# Patient Record
Sex: Female | Born: 1965 | Race: White | Hispanic: No | Marital: Married | Smoking: Never smoker
Health system: Southern US, Community
[De-identification: ages and names within clinical notes are randomized; demographics above are authoritative.]

## PROBLEM LIST (undated history)

## (undated) DIAGNOSIS — C50311 Malignant neoplasm of lower-inner quadrant of right female breast: Principal | ICD-10-CM

## (undated) DIAGNOSIS — D219 Benign neoplasm of connective and other soft tissue, unspecified: Secondary | ICD-10-CM

## (undated) DIAGNOSIS — C44712 Basal cell carcinoma of skin of right lower limb, including hip: Secondary | ICD-10-CM

## (undated) DIAGNOSIS — F419 Anxiety disorder, unspecified: Secondary | ICD-10-CM

## (undated) HISTORY — DX: Benign neoplasm of connective and other soft tissue, unspecified: D21.9

## (undated) HISTORY — PX: FINGER FRACTURE SURGERY: SHX638

## (undated) HISTORY — DX: Malignant neoplasm of lower-inner quadrant of right female breast: C50.311

## (undated) HISTORY — PX: WISDOM TOOTH EXTRACTION: SHX21

---

## 1998-09-01 ENCOUNTER — Inpatient Hospital Stay (HOSPITAL_COMMUNITY): Admission: AD | Admit: 1998-09-01 | Discharge: 1998-09-03 | Payer: Self-pay | Admitting: Obstetrics and Gynecology

## 2000-12-30 ENCOUNTER — Other Ambulatory Visit: Admission: RE | Admit: 2000-12-30 | Discharge: 2000-12-30 | Payer: Self-pay | Admitting: Obstetrics and Gynecology

## 2002-01-05 ENCOUNTER — Other Ambulatory Visit: Admission: RE | Admit: 2002-01-05 | Discharge: 2002-01-05 | Payer: Self-pay | Admitting: Obstetrics and Gynecology

## 2003-01-18 ENCOUNTER — Other Ambulatory Visit: Admission: RE | Admit: 2003-01-18 | Discharge: 2003-01-18 | Payer: Self-pay | Admitting: Obstetrics and Gynecology

## 2004-01-20 ENCOUNTER — Other Ambulatory Visit: Admission: RE | Admit: 2004-01-20 | Discharge: 2004-01-20 | Payer: Self-pay | Admitting: Obstetrics and Gynecology

## 2005-01-21 ENCOUNTER — Other Ambulatory Visit: Admission: RE | Admit: 2005-01-21 | Discharge: 2005-01-21 | Payer: Self-pay | Admitting: *Deleted

## 2006-01-21 ENCOUNTER — Other Ambulatory Visit: Admission: RE | Admit: 2006-01-21 | Discharge: 2006-01-21 | Payer: Self-pay | Admitting: Obstetrics & Gynecology

## 2007-01-25 ENCOUNTER — Other Ambulatory Visit: Admission: RE | Admit: 2007-01-25 | Discharge: 2007-01-25 | Payer: Self-pay | Admitting: Obstetrics & Gynecology

## 2008-02-01 ENCOUNTER — Other Ambulatory Visit: Admission: RE | Admit: 2008-02-01 | Discharge: 2008-02-01 | Payer: Self-pay | Admitting: Obstetrics & Gynecology

## 2013-02-16 ENCOUNTER — Encounter: Payer: Self-pay | Admitting: Obstetrics & Gynecology

## 2013-05-24 DIAGNOSIS — C44712 Basal cell carcinoma of skin of right lower limb, including hip: Secondary | ICD-10-CM

## 2013-05-24 HISTORY — DX: Basal cell carcinoma of skin of right lower limb, including hip: C44.712

## 2013-05-24 HISTORY — PX: SKIN BIOPSY: SHX1

## 2013-07-08 ENCOUNTER — Other Ambulatory Visit: Payer: Self-pay | Admitting: Obstetrics & Gynecology

## 2013-07-09 NOTE — Telephone Encounter (Signed)
Last refilled: AEX 06/05/12 #4 packs with 3 refills   Current AEX: 08/31/13 with Dr.Miller  Patient takes [facility] continuous   [place] #4 packs with no refills sent to pharmacy to last patient until AEX  Routed to provider, encounter closed.

## 2013-08-29 ENCOUNTER — Ambulatory Visit: Payer: Self-pay | Admitting: Obstetrics & Gynecology

## 2013-08-30 ENCOUNTER — Encounter: Payer: Self-pay | Admitting: Obstetrics & Gynecology

## 2013-08-31 ENCOUNTER — Ambulatory Visit (INDEPENDENT_AMBULATORY_CARE_PROVIDER_SITE_OTHER): Payer: BC Managed Care – PPO | Admitting: Obstetrics & Gynecology

## 2013-08-31 ENCOUNTER — Ambulatory Visit: Payer: Self-pay | Admitting: Obstetrics & Gynecology

## 2013-08-31 ENCOUNTER — Encounter: Payer: Self-pay | Admitting: Obstetrics & Gynecology

## 2013-08-31 VITALS — BP 100/58 | HR 60 | Resp 16 | Ht 64.75 in | Wt 125.8 lb

## 2013-08-31 DIAGNOSIS — Z0000 Encounter for general adult medical examination without abnormal findings: Secondary | ICD-10-CM

## 2013-08-31 DIAGNOSIS — Z124 Encounter for screening for malignant neoplasm of cervix: Secondary | ICD-10-CM

## 2013-08-31 DIAGNOSIS — Z01419 Encounter for gynecological examination (general) (routine) without abnormal findings: Secondary | ICD-10-CM

## 2013-08-31 LAB — POCT URINALYSIS DIPSTICK
Bilirubin, UA: NEGATIVE
GLUCOSE UA: NEGATIVE
Ketones, UA: NEGATIVE
LEUKOCYTES UA: NEGATIVE
NITRITE UA: NEGATIVE
PROTEIN UA: NEGATIVE
UROBILINOGEN UA: NEGATIVE
pH, UA: 5

## 2013-08-31 LAB — HEMOGLOBIN, FINGERSTICK: HEMOGLOBIN, FINGERSTICK: 11.8 g/dL — AB (ref 12.0–16.0)

## 2013-08-31 MED ORDER — LEVONORGESTREL-ETHINYL ESTRAD 0.1-20 MG-MCG PO TABS: 1.0000 | ORAL_TABLET | Freq: Every day | ORAL | Status: DC

## 2013-08-31 NOTE — Progress Notes (Signed)
48 y.o. G1P1 MarriedCaucasianF here for annual exam.  Daughter turns 8 today and getting driver's permit.  Cycles still regular.  Lasting 2-3 days.  Does take pills continuously.    Seen yearly at [facility].  Labs done there.  Everything is good per patient.    Patient's last menstrual period was 08/07/2013.          Sexually active: yes  The current method of family planning is OCP (estrogen/progesterone).    Exercising: yes  weights, cardio, pilates Smoker:  no  Health Maintenance: Pap:  06/03/11 WNL/negative HR HPV History of abnormal Pap:  no MMG:  01/19/13 3D-normal Colonoscopy:  none BMD:   none TDaP:  9/09 Screening Labs: [facility], Hb today: 11.8, Urine today: RBC-trace   reports that she has never smoked. She has never used smokeless tobacco. She reports that she drinks alcohol. She reports that she does not use illicit drugs.  Past Medical History  Diagnosis Date  . Fibroids     Past Surgical History  Procedure Laterality Date  . Finger fracture surgery      left hand   . Skin biopsy  1/15    basal cell    Current Outpatient Prescriptions  Medication Sig Dispense Refill  . CRYSELLE-28 0.3-30 MG-MCG tablet TAKE 1 TABLET DAILY. NO    PLACEBOS, TAKE             CONTINUOUSLY.  4 Package  0  . loratadine (CLARITIN) 10 MG tablet Take 10 mg by mouth daily as needed for allergies.      . Multiple Vitamins-Minerals (MULTIVITAMIN PO) Take by mouth.       No current facility-administered medications for this visit.    Family History  Problem Relation Age of Onset  . Diabetes Paternal Grandfather   . Colon cancer Maternal Grandfather   . Breast cancer Other     maternal great grandmother  . Heart attack Paternal Grandfather   . Renal Disease Maternal Grandfather     ROS:  Pertinent items are noted in HPI.  Otherwise, a comprehensive ROS was negative.  Exam:   BP 100/58  Pulse 60  Resp 16  Ht 5' 4.75" (1.645 m)  Wt 125 lb 12.8 oz (57.063 kg)  BMI 21.09 kg/m2  LMP  08/07/2013  Weight change: +1#   Height: 5' 4.75" (164.5 cm)  Ht Readings from Last 3 Encounters:  08/31/13 5' 4.75" (1.645 m)    General appearance: alert, cooperative and appears stated age Head: Normocephalic, without obvious abnormality, atraumatic Neck: no adenopathy, supple, symmetrical, trachea midline and thyroid normal to inspection and palpation Lungs: clear to auscultation bilaterally Breasts: normal appearance, no masses or tenderness Heart: regular rate and rhythm Abdomen: soft, non-tender; bowel sounds normal; no masses,  no organomegaly Extremities: extremities normal, atraumatic, no cyanosis or edema Skin: Skin color, texture, turgor normal. No rashes or lesions Lymph nodes: Cervical, supraclavicular, and axillary nodes normal. No abnormal inguinal nodes palpated Neurologic: Grossly normal   Pelvic: External genitalia:  no lesions              Urethra:  normal appearing urethra with no masses, tenderness or lesions              Bartholins and Skenes: normal                 Vagina: normal appearing vagina with normal color and discharge, no lesions              Cervix: no  lesions              Pap taken: yes Bimanual Exam:  Uterus: Enlarged and globular, 8 weeks, mobile.  No change in size.              Adnexa: normal adnexa and no mass, fullness, tenderness               Rectovaginal: Confirms               Anus:  normal sphincter tone, no lesions  A:  Well Woman with normal exam Fibroid uterus On OCPs for [facility] Grandfather with colon cancer in his 50 Grade 4 breast density  P:   Mammogram yearly.  D/W pt 3D MMG pap smear only obtained Labs with [facility].  Pt will send me a copy. Colonoscopy at age 35. Change OCPs to [facility].  Rx for 90 day supply/4RF. return annually or prn  An After Visit Summary was printed and given to the patient.

## 2013-08-31 NOTE — Patient Instructions (Signed)

## 2013-09-03 LAB — IPS PAP SMEAR ONLY

## 2014-03-25 ENCOUNTER — Encounter: Payer: Self-pay | Admitting: Obstetrics & Gynecology

## 2014-09-06 ENCOUNTER — Encounter: Payer: Self-pay | Admitting: Obstetrics & Gynecology

## 2014-09-06 ENCOUNTER — Ambulatory Visit (INDEPENDENT_AMBULATORY_CARE_PROVIDER_SITE_OTHER): Payer: BLUE CROSS/BLUE SHIELD | Admitting: Obstetrics & Gynecology

## 2014-09-06 VITALS — BP 96/60 | HR 80 | Resp 16 | Ht 65.0 in | Wt 127.0 lb

## 2014-09-06 DIAGNOSIS — Z01419 Encounter for gynecological examination (general) (routine) without abnormal findings: Secondary | ICD-10-CM

## 2014-09-06 MED ORDER — LEVONORGESTREL-ETHINYL ESTRAD 0.1-20 MG-MCG PO TABS: 1.0000 | ORAL_TABLET | Freq: Every day | ORAL | Status: DC

## 2014-09-06 NOTE — Progress Notes (Signed)
49 y.o. G1P1 MarriedCaucasianF here for annual exam.  Doing well.  Daughter has started driving and is a Administrator, arts.  Cycles are still regular.  Flow is getting lighter and now two to three days.    Does yearly biometrics testing for health insurance.  All lab testing done then was normal.    Patient's last menstrual period was 08/28/2014.          Sexually active: Yes.    The current method of family planning is OCP (estrogen/progesterone).    Exercising: Yes.    Weights, cardio, pilates and yoga Smoker:  no  Health Maintenance: Pap:  08/31/13 Neg History of abnormal Pap:  no MMG: 01/21/14 BIRADS1:Neg Self Breast Exam: yes, monthly Colonoscopy: None BMD:   None TDaP: 2009  Screening Labs: PCP, Hb today: PCP, Urine today: PCP   reports that she has never smoked. She has never used smokeless tobacco. She reports that she drinks alcohol. She reports that she does not use illicit drugs.  Past Medical History  Diagnosis Date  . Fibroids     Past Surgical History  Procedure Laterality Date  . Finger fracture surgery      left hand   . Skin biopsy  1/15    basal cell, right thigh    Current Outpatient Prescriptions  Medication Sig Dispense Refill  . levonorgestrel-ethinyl estradiol (AVIANE,ALESSE,LESSINA) 0.1-20 MG-MCG tablet Take 1 tablet by mouth daily. 3 Package 4  . loratadine (CLARITIN) 10 MG tablet Take 10 mg by mouth daily as needed for allergies.    . Multiple Vitamins-Minerals (MULTIVITAMIN PO) Take by mouth.    . terbinafine (LAMISIL) 250 MG tablet Take 250 mg by mouth daily.  1   No current facility-administered medications for this visit.    Family History  Problem Relation Age of Onset  . Diabetes Paternal Grandfather   . Colon cancer Maternal Grandfather 91    lived to 72  . Breast cancer Other     maternal great grandmother  . Heart attack Paternal Grandfather   . Renal Disease Maternal Grandfather     ROS:  Pertinent items are noted in HPI.  Otherwise, a  comprehensive ROS was negative.  Exam:   BP 96/60 mmHg  Pulse 80  Resp 16  Ht 5\' 5"  (1.651 m)  Wt 127 lb (57.607 kg)  BMI 21.13 kg/m2  LMP 08/28/2014 Height: 5\' 5"  (165.1 cm)  Ht Readings from Last 3 Encounters:  09/06/14 5\' 5"  (1.651 m)  08/31/13 5' 4.75" (1.645 m)    General appearance: alert, cooperative and appears stated age Head: Normocephalic, without obvious abnormality, atraumatic Neck: no adenopathy, supple, symmetrical, trachea midline and thyroid normal to inspection and palpation Lungs: clear to auscultation bilaterally Breasts: normal appearance, no masses or tenderness Heart: regular rate and rhythm Abdomen: soft, non-tender; bowel sounds normal; no masses,  no organomegaly Extremities: extremities normal, atraumatic, no cyanosis or edema Skin: Skin color, texture, turgor normal. No rashes or lesions Lymph nodes: Cervical, supraclavicular, and axillary nodes normal. No abnormal inguinal nodes palpated Neurologic: Grossly normal   Pelvic: External genitalia:  no lesions              Urethra:  normal appearing urethra with no masses, tenderness or lesions              Bartholins and Skenes: normal                 Vagina: normal appearing vagina with normal color and discharge, no lesions  Cervix: no lesions              Pap taken: No. Bimanual Exam:  Uterus:  normal size, contour, position, consistency, mobility, non-tender              Adnexa: normal adnexa and no mass, fullness, tenderness               Rectovaginal: Confirms               Anus:  normal sphincter tone, no lesions  Chaperone was present for exam.  A:  Well Woman with normal exam Fibroid uterus On OCPs for [facility] Grandfather with colon cancer Grade 4 breast density  P: Mammogram yearly. D/W pt 3D MMG pap smear only obtained 2015.  No Pap. Colonoscopy at age 2. On A[facility]se. Rx for 90 day supply/4RF. return annually or prn

## 2015-01-23 HISTORY — PX: BREAST SURGERY: SHX581

## 2015-02-07 ENCOUNTER — Encounter: Payer: Self-pay | Admitting: *Deleted

## 2015-02-07 ENCOUNTER — Telehealth: Payer: Self-pay | Admitting: *Deleted

## 2015-02-07 ENCOUNTER — Other Ambulatory Visit: Payer: Self-pay | Admitting: Radiology

## 2015-02-07 DIAGNOSIS — C50911 Malignant neoplasm of unspecified site of right female breast: Secondary | ICD-10-CM

## 2015-02-07 DIAGNOSIS — C50311 Malignant neoplasm of lower-inner quadrant of right female breast: Secondary | ICD-10-CM

## 2015-02-07 HISTORY — DX: Malignant neoplasm of lower-inner quadrant of right female breast: C50.311

## 2015-02-07 NOTE — Telephone Encounter (Signed)
Confirmed [facility] for 02/12/15 at 0830 .  Instructions and contact information given.

## 2015-02-10 ENCOUNTER — Ambulatory Visit
Admission: RE | Admit: 2015-02-10 | Discharge: 2015-02-10 | Disposition: A | Discharge status: Home / Self Care | Payer: BLUE CROSS/BLUE SHIELD | Source: Ambulatory Visit | Attending: Radiology | Admitting: Radiology

## 2015-02-10 DIAGNOSIS — C50911 Malignant neoplasm of unspecified site of right female breast: Secondary | ICD-10-CM

## 2015-02-10 MED ORDER — GADOBENATE DIMEGLUMINE 529 MG/ML IV SOLN
10.0000 mL | Freq: Once | INTRAVENOUS | Status: AC | PRN
  Administered 2015-02-10: 10 mL via INTRAVENOUS

## 2015-02-11 NOTE — Progress Notes (Signed)
[facility]  [phone] Fax:([phone]) [place] Note   Patient Care Team: Leighton Ruff, MD as PCP - General (Family Medicine) Alphonsa Overall, MD as Consulting Physician (General Surgery) Truitt Merle, MD as Consulting Physician (Hematology) Thea Silversmith, MD as Consulting Physician (Radiation Oncology) Mauro Kaufmann, RN as Registered Nurse Rockwell Germany, RN as Registered Nurse 02/12/2015  CHIEF COMPLAINTS/PURPOSE OF CONSULTATION: Newly diagnosed right breast DCIS   Oncology History   Breast cancer of lower-inner quadrant of right female breast   Staging form: Breast, AJCC 7th Edition     Clinical stage from 02/12/2015: Stage 0 (Tis (DCIS), N0, M0) - Unsigned       Staging comments: Staged at breast conference on 9.21.16        Breast cancer of lower-inner quadrant of right female breast   01/28/2015 Mammogram Screening mammogram showed new regional heterogeneous calcifications in the right breast central to the nipple midline depth.    02/06/2015 Initial Biopsy Right breast core needle biopsy showed DCIS with calcifications and necrosis.   02/07/2015 Initial Diagnosis Breast cancer of lower-inner quadrant of right female breast   02/10/2015 Imaging Breast MRI showed non-mass enhancement involves majority of the lower, outer quadrant of the right breast spanning at least 3.4 x 5.0 x 5.2 cm. No other lesion or adenopathy.    HISTORY OF PRESENTING ILLNESS:  Carolyn Berg 49 y.o. female is here because of newly diagnosed right breast cancer. She presents to our multidisciplinary breast clinic to discuss further management. She is accompanied by her husband.  This was discovered by screening mammogram. She is very compliant with year screening mammogram. Her screening mammogram on 01/28/2015 showed a new regional heterogeneous calcification in the lower-outer quadrant of right breast. She underwent core needle biopsy on 02/07/2015, which showed  DCIS.  She feels well overall, denies any symptoms, no pain, dyspnea, weight loss or night sweats. She is a housewife, very physically active, no significant past medical history, no family history of breast cancer. She is premenopausal, takes birth control pill.  MEDICAL HISTORY:  Past Medical History  Diagnosis Date  . Fibroids   . 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 Breast cancer     SURGICAL HISTORY: Past Surgical History  Procedure Laterality Date  . Finger fracture surgery      left hand   . Skin biopsy  1/15    basal cell, right thigh   GYN HISTORY  Menarchal:13 LMP: 02/12/2015 Contraceptive: 20+ years  HRT: N/A  G1P1: daughter is 5, no breast feeding, no plan for more children    SOCIAL HISTORY: Social History   Social History  . Marital Status: Married    Spouse Name: N/A  . Number of Children: 1 daughter 52 yo   . Years of Education: N/A   Occupational History  . Not on file.   Social History Main Topics  . Smoking status: Never Smoker   . Smokeless tobacco: Never Used  . Alcohol Use: Yes     Comment: occ glass of wine  . Drug Use: No  . Sexual Activity:    Partners: Male    Birth Control/ Protection: Pill   Other Topics Concern  . Not on file   Social History Narrative    FAMILY HISTORY: Family History  Problem Relation Age of Onset  . Diabetes Paternal Grandfather   . Heart attack Paternal Grandfather   . Colon cancer Maternal Grandfather  49    lived to 53  . Renal Disease Maternal Grandfather   . Breast cancer Other     maternal great grandmother    ALLERGIES:  has No Known Allergies.  MEDICATIONS:  Current Outpatient Prescriptions  Medication Sig Dispense Refill  . levonorgestrel-ethinyl estradiol (AVIANE,ALESSE,LESSINA) 0.1-20 MG-MCG tablet Take 1 tablet by mouth daily. 3 Package 4   No current facility-administered medications for this visit.    REVIEW OF SYSTEMS:   Constitutional:  Denies fevers, chills or abnormal night sweats Eyes: Denies blurriness of vision, double vision or watery eyes Ears, nose, mouth, throat, and face: Denies mucositis or sore throat Respiratory: Denies cough, dyspnea or wheezes Cardiovascular: Denies palpitation, chest discomfort or lower extremity swelling Gastrointestinal:  Denies nausea, heartburn or change in bowel habits Skin: Denies abnormal skin rashes Lymphatics: Denies new lymphadenopathy or easy bruising Neurological:Denies numbness, tingling or new weaknesses Behavioral/Psych: Mood is stable, no new changes  All other systems were reviewed with the patient and are negative.  PHYSICAL EXAMINATION: ECOG PERFORMANCE STATUS: 0 - Asymptomatic  Filed Vitals:   02/12/15 0858  BP: 127/73  Pulse: 96  Temp: 98.2 F (36.8 C)  Resp: 20   Filed Weights   02/12/15 0858  Weight: 122 lb 14.4 oz (55.747 kg)    GENERAL:alert, no distress and comfortable SKIN: skin color, texture, turgor are normal, no rashes or significant lesions EYES: normal, conjunctiva are pink and non-injected, sclera clear OROPHARYNX:no exudate, no erythema and lips, buccal mucosa, and tongue normal  NECK: supple, thyroid normal size, non-tender, without nodularity LYMPH:  no palpable lymphadenopathy in the cervical, axillary or inguinal LUNGS: clear to auscultation and percussion with normal breathing effort HEART: regular rate & rhythm and no murmurs and no lower extremity edema ABDOMEN:abdomen soft, non-tender and normal bowel sounds Musculoskeletal:no cyanosis of digits and no clubbing  PSYCH: alert & oriented x 3 with fluent speech NEURO: no focal motor/sensory deficits Breasts: Breast inspection showed them to be symmetrical with no nipple discharge. (+) Mild Skin ecchymosis in the lower outer quadrant of right breast biopsy site, Palpation of both breasts and axilla revealed no obvious mass that I could appreciate.   LABORATORY DATA:  I have reviewed  the data as listed Lab Results  Component Value Date   WBC 4.1 02/12/2015   HGB 12.6 02/12/2015   HCT 37.9 02/12/2015   MCV 92.7 02/12/2015   PLT 347 02/12/2015    Recent Labs  02/12/15 0848  NA 141  K 4.2  CO2 25  GLUCOSE 95  BUN 10.9  CREATININE 0.9  CALCIUM 9.1  PROT 7.1  ALBUMIN 3.8  AST 16  ALT 11  ALKPHOS 50  BILITOT 0.56    PATHOLOGY REPORT  Diagnosis 02/06/2015 Breast, right, needle core biopsy -Ductal carcinoma in situ with calcifications and necrosis.  ER and PR pending    RADIOGRAPHIC STUDIES: I have personally reviewed the radiological images as listed and agreed with the findings in the report.  Mr Breast Bilateral W Wo Contrast 02/10/2015    FINDINGS: Breast composition: d. Extreme fibroglandular tissue.  Background parenchymal enhancement: Marked  Right breast: There is asymmetric non mass enhancement involving the majority of the lower, outer quadrant of the right breast when compared to the left. This non mass enhancement is difficult to precisely measure given the marked background enhancement. However, the non mass enhancement appears to span at least 3.4 (AP) x 5.0 (TR) x 5.2 (CC) cm. Biopsy marker artifact corresponding to the known site  of DCIS is seen within the inferolateral aspect of this enhancement in the lower, outer right breast, series 5, image 115.  Left breast: No dominant mass or suspicious enhancement. There is marked background parenchymal enhancement.  Lymph nodes: No abnormal appearing lymph nodes.  Ancillary findings:  None.   IMPRESSION: Asymmetric, non mass enhancement involves the majority of the lower, outer quadrant of the right breast spanning at least 3.4 x 5.0 x 5.2 cm. This non mass enhancement appears to correspond to the extent of calcifications seen mammographically. If breast conservation is being considered, additional stereotactic biopsy of the right breast may be warranted for further evaluation of extent of disease.   RECOMMENDATION: Treatment planning.  BI-RADS CATEGORY  6: Known biopsy-proven malignancy.   Electronically Signed   By: Pamelia Hoit M.D.   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  1. right breast DCIS, ER/PR pending -I reviewed her imaging findings including breast MRI, and a biopsy results in details with the patient and her husband. -Due to the large size of the non-mass enhancement in the right breast, Dr. Lucia Gaskins recommends mastectomy, if she still wants lumpectomy, she would need additional biopsy of the non-mass enhancement area. -The patient had early stage disease. She is considered cured of disease by complete surgical resection. We discussed the increased risk of second breast cancer, including invasive breast cancer. -Any form of adjuvant treatment is for prevention of disease recurrence.  -If she has mastectomy, she would not need adjuvant breast radiation -If her tumor is ER or PR positive, I recommend chemoprevention with tamoxifen. The potential side effects of tamoxifen were reviewed with patient, and I'll see her back after her surgery. -I'll call her with ER/PR results when this is available. -She is very interested in genetic testing. She will call her mother to see if she has family history of breast cancer which she is not aware. She will call us back.  Plan: I'll see her 2-3 weeks after her breast surgery, to review her final surgical path and finalize her antiestrogen therapy.  All questions were answered. The patient knows to call the clinic with any problems, questions or concerns. I spent 40 minutes counseling the patient face to face. The total time spent in the appointment was 50 minutes and more than 50% was on counseling.       Truitt Merle, MD 02/12/2015 11:04 AM

## 2015-02-12 ENCOUNTER — Encounter: Payer: Self-pay | Admitting: *Deleted

## 2015-02-12 ENCOUNTER — Ambulatory Visit (HOSPITAL_BASED_OUTPATIENT_CLINIC_OR_DEPARTMENT_OTHER): Payer: BLUE CROSS/BLUE SHIELD | Admitting: Hematology

## 2015-02-12 ENCOUNTER — Encounter: Payer: Self-pay | Admitting: Skilled Nursing Facility1

## 2015-02-12 ENCOUNTER — Encounter: Payer: Self-pay | Admitting: Genetic Counselor

## 2015-02-12 ENCOUNTER — Encounter: Payer: Self-pay | Admitting: Hematology

## 2015-02-12 ENCOUNTER — Telehealth: Payer: Self-pay | Admitting: Obstetrics & Gynecology

## 2015-02-12 ENCOUNTER — Encounter: Payer: Self-pay | Admitting: Nurse Practitioner

## 2015-02-12 ENCOUNTER — Encounter: Payer: Self-pay | Admitting: Physical Therapy

## 2015-02-12 ENCOUNTER — Ambulatory Visit: Payer: BLUE CROSS/BLUE SHIELD | Attending: Surgery | Admitting: Physical Therapy

## 2015-02-12 ENCOUNTER — Other Ambulatory Visit (HOSPITAL_BASED_OUTPATIENT_CLINIC_OR_DEPARTMENT_OTHER): Payer: BLUE CROSS/BLUE SHIELD

## 2015-02-12 ENCOUNTER — Ambulatory Visit
Admission: RE | Admit: 2015-02-12 | Discharge: 2015-02-12 | Disposition: A | Discharge status: Home / Self Care | Payer: BLUE CROSS/BLUE SHIELD | Source: Ambulatory Visit | Attending: Radiation Oncology | Admitting: Radiation Oncology

## 2015-02-12 ENCOUNTER — Ambulatory Visit (HOSPITAL_BASED_OUTPATIENT_CLINIC_OR_DEPARTMENT_OTHER): Payer: BLUE CROSS/BLUE SHIELD | Admitting: Genetic Counselor

## 2015-02-12 VITALS — BP 127/73 | HR 96 | Temp 98.2°F | Resp 20 | Ht 64.5 in | Wt 122.9 lb

## 2015-02-12 DIAGNOSIS — C50311 Malignant neoplasm of lower-inner quadrant of right female breast: Secondary | ICD-10-CM

## 2015-02-12 DIAGNOSIS — C50911 Malignant neoplasm of unspecified site of right female breast: Secondary | ICD-10-CM | POA: Diagnosis not present

## 2015-02-12 DIAGNOSIS — Z803 Family history of malignant neoplasm of breast: Secondary | ICD-10-CM

## 2015-02-12 DIAGNOSIS — Z800 Family history of malignant neoplasm of digestive organs: Secondary | ICD-10-CM

## 2015-02-12 LAB — COMPREHENSIVE METABOLIC PANEL (CC13)
ALBUMIN: 3.8 g/dL (ref 3.5–5.0)
ALK PHOS: 50 U/L (ref 40–150)
ALT: 11 U/L (ref 0–55)
AST: 16 U/L (ref 5–34)
Anion Gap: 9 mEq/L (ref 3–11)
BUN: 10.9 mg/dL (ref 7.0–26.0)
CO2: 25 meq/L (ref 22–29)
Calcium: 9.1 mg/dL (ref 8.4–10.4)
Chloride: 108 mEq/L (ref 98–109)
Creatinine: 0.9 mg/dL (ref 0.6–1.1)
EGFR: 77 mL/min/{1.73_m2} — AB (ref >90)
GLUCOSE: 95 mg/dL (ref 70–140)
POTASSIUM: 4.2 meq/L (ref 3.5–5.1)
SODIUM: 141 meq/L (ref 136–145)
Total Bilirubin: 0.56 mg/dL (ref 0.20–1.20)
Total Protein: 7.1 g/dL (ref 6.4–8.3)

## 2015-02-12 LAB — CBC WITH DIFFERENTIAL/PLATELET
BASO%: 0.5 % (ref 0.0–2.0)
BASOS ABS: 0 10*3/uL (ref 0.0–0.1)
EOS%: 0.5 % (ref 0.0–7.0)
Eosinophils Absolute: 0 10*3/uL (ref 0.0–0.5)
HCT: 37.9 % (ref 34.8–46.6)
HEMOGLOBIN: 12.6 g/dL (ref 11.6–15.9)
LYMPH%: 29.5 % (ref 14.0–49.7)
MCH: 30.8 pg (ref 25.1–34.0)
MCHC: 33.2 g/dL (ref 31.5–36.0)
MCV: 92.7 fL (ref 79.5–101.0)
MONO#: 0.4 10*3/uL (ref 0.1–0.9)
MONO%: 8.8 % (ref 0.0–14.0)
NEUT%: 60.7 % (ref 38.4–76.8)
NEUTROS ABS: 2.5 10*3/uL (ref 1.5–6.5)
Platelets: 347 10*3/uL (ref 145–400)
RBC: 4.09 10*6/uL (ref 3.70–5.45)
RDW: 13.2 % (ref 11.2–14.5)
WBC: 4.1 10*3/uL (ref 3.9–10.3)
lymph#: 1.2 10*3/uL (ref 0.9–3.3)

## 2015-02-12 NOTE — Progress Notes (Signed)
Carolyn Berg is a very pleasant 49 y.o. female from [place], [place] with newly diagnosed ductal carcinoma in situ of the right breast.  Biopsy results revealed the tumor's prognostic profile is ER positive and PR positive.  She presents today with her husband to the [facility]) for treatment consideration and recommendations from the breast surgeon, radiation oncologist, and medical oncologist.     I briefly met with Carolyn Berg and her husband during her [facility] visit today. We discussed the purpose of the Survivorship Clinic, which will include monitoring for recurrence, coordinating completion of age and gender-appropriate cancer screenings, promotion of overall wellness, as well as managing potential late/long-term side effects of anti-cancer treatments.    The treatment plan for Carolyn Berg will likely include surgery followed by potential radiation therapy depending the type of surgery decided, and anti-estrogen therapy.  She will meet with the Genetics Counselor due to her age. As of today, the intent of treatment for Carolyn Berg is cure, therefore she will be eligible for the Survivorship Clinic upon her completion of treatment.  Her survivorship care plan (SCP) document will be drafted and updated throughout the course of her treatment trajectory. She will receive the SCP in an office visit with myself in the Survivorship Clinic once she has completed treatment.   Carolyn Berg was encouraged to ask questions and all questions were answered to her satisfaction.  She was given my business card and encouraged to contact me with any concerns regarding survivorship.  I look forward to participating in her care.   Kenn File, [place] [phone]

## 2015-02-12 NOTE — Progress Notes (Signed)
REFERRING PROVIDER: Leighton Ruff, MD [place], [place] [address]   Truitt Merle, MD  PRIMARY PROVIDER:  Gerrit Heck, MD  PRIMARY REASON FOR VISIT:  1. Breast cancer of lower-inner quadrant of right female breast   2. Family history of colon cancer   3. Family history of breast cancer      HISTORY OF PRESENT ILLNESS:   Carolyn Berg, a 49 y.o. female, was seen for a [place] cancer genetics consultation at the request of Dr. Burr Medico due to a personal and family history of cancer.  [name] presents to clinic today to discuss the possibility of a hereditary predisposition to cancer, genetic testing, and to further clarify her future cancer risks, as well as potential cancer risks for family members.   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 This will be treated with either a lumpectomy or mastectomy, depending on her genetics appointment.  She will receive tamoxifen.   CANCER HISTORY:  Oncology History   Breast cancer of lower-inner quadrant of right female breast   Staging form: Breast, AJCC 7th Edition     Clinical stage from 02/12/2015: Stage 0 (Tis (DCIS), N0, M0) - Unsigned       Staging comments: Staged at breast conference on 9.21.16        Breast cancer of lower-inner quadrant of right female breast   01/28/2015 Mammogram Screening mammogram showed new regional heterogeneous calcifications in the right breast central to the nipple midline depth.    02/06/2015 Initial Biopsy Right breast core needle biopsy showed DCIS with calcifications and necrosis.   02/07/2015 Initial Diagnosis Breast cancer of lower-inner quadrant of right female breast   02/10/2015 Imaging Breast MRI showed non-mass enhancement involves majority of the lower, outer quadrant of the right breast spanning at least 3.4 x 5.0 x 5.2 cm. No other lesion or adenopathy.     HORMONAL RISK FACTORS:  Menarche was at age 85.  First live birth  at age 4.  OCP use for approximately 20+ years.  Ovaries intact: yes.  Hysterectomy: no.  Menopausal status: premenopausal.  HRT use: 0 years. Colonoscopy: no; not examined. Mammogram within the last year: yes. Number of breast biopsies: 1. Up to date with pelvic exams:  yes. Any excessive radiation exposure in the past:  no  Past Medical History  Diagnosis Date  . Fibroids   . 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 Breast cancer     Past Surgical History  Procedure Laterality Date  . Finger fracture surgery      left hand   . Skin biopsy  1/15    basal cell, right thigh    Social History   Social History  . Marital Status: Married    Spouse Name: Herbie Baltimore  . Number of Children: 1  . Years of Education: N/A   Social History Main Topics  . Smoking status: Never Smoker   . Smokeless tobacco: Never Used  . Alcohol Use: Yes     Comment: occ glass of wine  . Drug Use: No  . Sexual Activity:    Partners: Male    Birth Control/ Protection: Pill   Other Topics Concern  . Not on file   Social History Narrative     FAMILY HISTORY:  We obtained a detailed, 4-generation family history.  Significant diagnoses are listed below: Family History  Problem Relation Age of Onset  .  Diabetes Paternal Grandfather   . Heart attack Paternal Grandfather   . Colon cancer Maternal Grandfather 28    lived to 23  . Renal Disease Maternal Grandfather   . Breast cancer Other     MGF's mother dx over 35  . Lung cancer Maternal Uncle   . Stroke Maternal Grandmother    The patient has one daughter and is an only child.  Her mother had one brother who was a smoker and had lung cancer, and a sister who is cancer free.  Her maternal grandfather was diagnosed with colon cancer at 20 and died at 86.  Her grandmother died of a stroke in his mid 21s.  Her grandfather's mother had breast cancer.  Ms. Turri father had 8 brothers and sisters, none of whom had  cancer.  His parents died in their 36s.  There is no other reported family history of cancer.  Patient's maternal ancestors are of [place] and [place] descent, and paternal ancestors are of [place] descent. There is no reported Ashkenazi Jewish ancestry. There is no known consanguinity.  GENETIC COUNSELING ASSESSMENT: SHANEYA Berg is a 49 y.o. female with a personal history of breast cancer nad family history of breast and colon cancer which somewhat suggestive of a hereditary cancer syndrome and predisposition to cancer. We, therefore, discussed and recommended the following at today's visit.   DISCUSSION: We discussed hereditary cancer syndromes including those resulting from BRCA mutations. Discussed that about 5-10% of breast cancer is hereditary.  Her family his small, and therefore her family may not present as a typical family with a hereditary cancer syndrome.  We reviewed the characteristics, features and inheritance patterns of hereditary cancer syndromes. We also discussed genetic testing, including the appropriate family members to test, the process of testing, insurance coverage and turn-around-time for results. We discussed the implications of a negative, positive and/or variant of uncertain significant result. We recommended Ms. Eisinger pursue genetic testing for the Breast/Ovarian cancer gene panel. The Breast/Ovarian gene panel offered by [facility] includes sequencing and rearrangement analysis for the following 20 genes:  ATM, BARD1, BRCA1, BRCA2, BRIP1, CDH1, CHEK2, EPCAM, FANCC, MLH1, MSH2, MSH6, NBN, PALB2, PMS2, PTEN, RAD51C, RAD51D, TP53, and XRCC2.     Based on Ms. Secord's personal and family history of cancer, she meets medical criteria for genetic testing. Despite that she meets criteria, she may still have an out of pocket cost. We discussed that if her out of pocket cost for testing is over $100, the laboratory will call and confirm whether she wants to proceed with testing.  If the out of  pocket cost of testing is less than $100 she will be billed by the genetic testing laboratory.   PLAN: After considering the risks, benefits, and limitations, Ms. Hilmer  provided informed consent to pursue genetic testing and the blood sample was sent to [facility] for analysis of the Breast/Ovarian cancer panel. We ordered the test as [place] for Ms. Verbeek's surgical decisions, and therefore results should hopefully be available within approximately 2 weeks' time, at which point they will be disclosed by telephone to Ms. Zapata, as will any additional recommendations warranted by these results. Ms. Goodlin will receive a summary of her genetic counseling visit and a copy of her results once available. This information will also be available in Epic. We encouraged Ms. Odoherty to remain in contact with cancer genetics annually so that we can continuously update the family history and inform her of any changes in cancer  genetics and testing that may be of benefit for her family. Ms. Maroney questions were answered to her satisfaction today. Our contact information was provided should additional questions or concerns arise.  Lastly, we encouraged Ms. Guldin to remain in contact with cancer genetics annually so that we can continuously update the family history and inform her of any changes in cancer genetics and testing that may be of benefit for this family.   Ms.  Eid questions were answered to her satisfaction today. Our contact information was provided should additional questions or concerns arise. Thank you for the referral and allowing [place] to share in the care of your patient.   Karen P. Florene Glen, [place], [facility] Certified Genetic Counselor [email or web address] phone: [phone]  The patient was seen for a total of 45 minutes in face-to-face genetic counseling.  This patient was discussed with Drs. Magrinat, Lindi Adie and/or Burr Medico who agrees with the above.     _______________________________________________________________________ For Office Staff:  Number of people involved in session: 2 Was an Intern/ student involved with case: no

## 2015-02-12 NOTE — Telephone Encounter (Signed)
Patient received results from her "DCIS test with positive estrogen and progesterone receptors". Patient is going to stop taking her birth control. Please call.

## 2015-02-12 NOTE — Progress Notes (Signed)
Clinical Social Work [place] Psychosocial Distress Screening [place]  Patient completed distress screening protocol and scored a 10 on the Psychosocial Distress Thermometer which indicates severe distress. Clinical Social Worker met with patient and patients husband in [facility] to assess for distress and other psychosocial needs. Patient stated she was doing "ok", but was anxious to get additional test completed to determine treatment plan. CSW and patient discussed common feeling and emotions when being diagnosed with cancer, and the importance of support during treatment. CSW informed patient of the support team and support services at [facility], and was agreeable to an alight guide referral. CSW provided contact information and encouraged patient to call with any questions or concerns.  ONCBCN DISTRESS SCREENING 02/12/2015  Screening Type Initial Screening  Distress experienced in past week (1-10) 10  Emotional problem type Nervousness/Anxiety;Adjusting to illness  Information Concerns Type Lack of info about diagnosis;Lack of info about treatment;Lack of info about maintaining fitness  Physician notified of physical symptoms Yes  Referral to clinical psychology No  Referral to clinical social work Yes  Referral to dietition No  Referral to financial advocate No  Referral to support programs Yes  Referral to palliative care No   Johnnye Lana, MSW, LCSW, OSW-C Clinical Social Worker [place] [phone]

## 2015-02-12 NOTE — Telephone Encounter (Signed)
Spoke with patient. Patient states she was seen today with the cancer center. Was advised that she is positive for estrogen and progesterone receptors. Was advised to stop her birth control at this time. States she is in her off week currently. Advised not to restart new pill pack. Patient is agreeable and requests that I speak with Dr.Miller regarding alternative birth control methods. Advised patient most birth control methods have estrogen or progesterone or both. Patient would like me to speak with Dr.Miller regarding her recommendations. Advised I will speak with provider and return call. Patient is agreeable. Would also like Dr.MIller to know she is scheduled to see a genetics counselor today at 3 pm.

## 2015-02-12 NOTE — Progress Notes (Signed)
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 female who was found to have calcifications in the right breast in screening mammogram. Her breasts were very dense and she had an area of calcifications superior to this area of concern which were stable and measured about 3 cm. The area of changing calcifications were biopsied and showed DCIS with comedo necrosis. She is ER/PR positive. MRI of the bilateral breasts showed an area of non-mass enhancement measuring 3.4 x 5.0 x 5.2 cm and biopsy to document extent of disease was recommended.  She is present with her husband. She is anxious.   PREVIOUS RADIATION THERAPY: No  Past medical, social and family history were reviewed in the electronic chart. Review of symptoms was reviewed in the electronic chart. Medications were reviewed in the electronic chart.  Gynecologic History  Age at first menstrual period? 13  Are you still having periods? Yes Approximate date of last period? 01/22/15  If you are still having periods: Are your periods regular? Yes Obstetric History:  How many children have you carried to term? 1 Your age at first live birth? 21  Pregnant now or trying to get pregnant? No  Have you used birth control pills or hormone shots for contraception? Yes  If so, for how long (or approximate dates)? 26-27 years  Would you be interested in learning more about he options to preserve fertility? No Health Maintenance:  Have you ever had a colonoscopy? No  Have you ever had a bone density? No  Date of your last PAP smear? 09/06/14 Date of your FIRST mammogram? 9 years ago  PHYSICAL EXAM: BP Pulse Temp(Src) Resp Ht Wt     127/73 mmHg 96 98.2 F (36.8  C) (Oral) 20 5' 4.5" (1.638 m) 122 lb 14.4 oz (55.747 kg)    BMI SpO2            20.78 kg/m2 100%        Pleasant, nervous female. 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 She is going to meet with plastic surgery and then follow up with Dr. Lucia Gaskins.  She is leaning toward mastectomy but struggling with that decision.  She will require radiation if she elects for breast conservation or if she has 5 cm of invasive disease on her mastectomy specimen. She will receive antiestrogen therapy after surgery. She will be referred to genetic counseling. She met with surgery, medical oncology, physical therapy, dietician, survivorship NP and our breast cancer navigators.   I will meet with her after her surgery if appropriate.   I advised the patient to discontinue her birth control medication.    I spent 20 minutes  face to face with the patient and more than 50% of that time was spent in counseling and/or coordination of care.  This document serves as a record of services personally performed by Thea Silversmith, MD. It was created on her behalf by Darcus Austin, a trained medical scribe. The creation of this record is based on the scribe's personal observations and the provider's statements to them. This document has been checked and approved by the attending provider.     ------------------------------------------------  Thea Silversmith, MD

## 2015-02-12 NOTE — Therapy (Signed)
[place], [place], [address] Phone: [phone]   Fax:  [phone]  Physical Therapy Evaluation  Patient Details  Name: Carolyn Berg MRN: [id number] Date of Birth: Mar 30, 1966 Referring Provider:  Alphonsa Overall, MD  Encounter Date: 02/12/2015      PT End of Session - 02/12/15 1047    Visit Number 1   Number of Visits 1   PT Start Time 0952   PT Stop Time 1025   PT Time Calculation (min) 33 min   Activity Tolerance Patient tolerated treatment well   Behavior During Therapy Anxious      Past Medical History  Diagnosis Date  . Fibroids   . 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 Breast cancer     Past Surgical History  Procedure Laterality Date  . Finger fracture surgery      left hand   . Skin biopsy  1/15    basal cell, right thigh    There were no vitals filed for this visit.  Visit Diagnosis:  Breast cancer, right breast - Plan: PT plan of care cert/re-cert      Subjective Assessment - 02/12/15 1047    Pertinent History Patient was diagnosed 01/28/15 with right DCIS measuring 5.2 cm in size.  Her prognostic panel is pending.            [facility] PT Assessment - 02/12/15 0001    Assessment   Medical Diagnosis Right breast cancer   Onset Date/Surgical Date 01/28/15   Hand Dominance Right   Prior Therapy none   Precautions   Precautions Other (comment)  Active breast cancer   Restrictions   Weight Bearing Restrictions No   Balance Screen   Has the patient fallen in the past 6 months No   Has the patient had a decrease in activity level because of a fear of falling?  No   Is the patient reluctant to leave their home because of a fear of falling?  No   Home Social worker Private residence   Living Arrangements Spouse/significant other;Children  Husband and 36 y.o. daughter   Available Help at Discharge Family   Prior  Function   Level of [place] Other (comment)  Stay at home Mom   Leisure She goes to the gym 5x/wk and does Pilates, weights, cardio for at least 30 minutes   Cognition   Overall Cognitive Status Within Functional Limits for tasks assessed   Posture/Postural Control   Posture/Postural Control No significant limitations   ROM / Strength   AROM / PROM / Strength AROM;Strength   AROM   AROM Assessment Site Shoulder   Right/Left Shoulder Right;Left   Right Shoulder Extension 49 Degrees   Right Shoulder Flexion 148 Degrees   Right Shoulder ABduction 162 Degrees   Right Shoulder Internal Rotation 65 Degrees   Right Shoulder External Rotation 90 Degrees   Left Shoulder Extension 50 Degrees   Left Shoulder Flexion 148 Degrees   Left Shoulder ABduction 148 Degrees   Left Shoulder Internal Rotation 62 Degrees   Left Shoulder External Rotation 90 Degrees   Strength   Overall Strength Within functional limits for tasks performed           LYMPHEDEMA/ONCOLOGY QUESTIONNAIRE - 02/12/15 1044    Type   Cancer Type Right breast cancer   Lymphedema Assessments   Lymphedema Assessments Upper extremities   Right Upper Extremity  Lymphedema   10 cm Proximal to Olecranon Process 25.2 cm   Olecranon Process 23 cm   10 cm Proximal to Ulnar Styloid Process 19.9 cm   Just Proximal to Ulnar Styloid Process 13.4 cm   Across Hand at [employer] 17 cm   At [place] of 2nd Digit 5.6 cm   Left Upper Extremity Lymphedema   10 cm Proximal to Olecranon Process 25.2 cm   Olecranon Process 22.8 cm   10 cm Proximal to Ulnar Styloid Process 18.8 cm   Just Proximal to Ulnar Styloid Process 13.1 cm   Across Hand at [employer] 16.7 cm   At [place] of 2nd Digit 5.4 cm      Patient was instructed today in a home exercise program today for post op shoulder range of motion. These included active assist shoulder flexion in sitting, scapular retraction, wall walking with shoulder  abduction, and hands behind head external rotation.  She was encouraged to do these twice a day, holding 3 seconds and repeating 5 times when permitted by her physician.         PT Education - 02/12/15 1045    Education provided Yes   Education Details Lymphedema risk reduction and post op shoulder ROM HEP   Person(s) Educated Patient;Spouse   Methods Explanation;Demonstration;Handout   Comprehension Verbalized understanding              Breast Clinic Goals - 02/12/15 1058    Patient will be able to verbalize understanding of pertinent lymphedema risk reduction practices relevant to her diagnosis specifically related to skin care.   Time 1   Period Days   Status Achieved   Patient will be able to return demonstrate and/or verbalize understanding of the post-op home exercise program related to regaining shoulder range of motion.   Time 1   Period Days   Status Achieved   Patient will be able to verbalize understanding of the importance of attending the postoperative After Breast Cancer Class for further lymphedema risk reduction education and therapeutic exercise.   Time 1   Period Days   Status Achieved              Plan - 02/12/15 1047    Clinical Impression Statement Patient was diagnosed 01/28/15 with right DCIS measuring 5.2 cm in size.  Her prognostic panel is pending.  She is planning to have a right mastectomy with possible immediate reconstruction.  She will benefit from post op PT to regain shoulder range of motion and strength.   Pt will benefit from skilled therapeutic intervention in order to improve on the following deficits Decreased strength;Pain;Decreased knowledge of precautions;Impaired UE functional use;Decreased range of motion   Rehab Potential Excellent   Clinical Impairments Affecting Rehab Potential None   PT Frequency One time visit   PT Treatment/Interventions Therapeutic exercise;Patient/family education   Consulted and Agree with Plan of  Care Patient;Family member/caregiver   Family Member Consulted Husband     Patient will follow up at outpatient cancer rehab if needed following surgery.  If the patient requires physical therapy at that time, a specific plan will be dictated and sent to the referring physician for approval. The patient was educated today on appropriate basic range of motion exercises to begin post operatively and the importance of attending the After Breast Cancer class following surgery.  Patient was educated today on lymphedema risk reduction practices as it pertains to recommendations that will benefit the patient immediately following surgery.  She verbalized good understanding.  No additional physical therapy is indicated at this time.       Problem List Patient Active Problem List   Diagnosis Date Noted  . Breast cancer of lower-inner quadrant of right female breast 02/07/2015    Annia Friendly, PT 02/12/2015 11:00 AM  [place], [place], [address] Phone: [phone]   Fax:  [phone]

## 2015-02-12 NOTE — Patient Instructions (Signed)

## 2015-02-12 NOTE — Telephone Encounter (Signed)
I recommend condoms and spermacide at this time.  A nonhormonal option such as the ParaGard IUD may be appropriate.  After genetics testing returns and her breast care plan evolves, Dr. Sabra Heck will be able to make further recommendations for birth control options.   Cc- Dr. Sabra Heck

## 2015-02-12 NOTE — Progress Notes (Signed)
Subjective:     Patient ID: Carolyn Berg, female   DOB: 12/22/1965, 49 y.o.   MRN: [id number]  HPI   Review of Systems     Objective:   Physical Exam For the patient to understand and be given the tools to implement a healthy plant based diet during their cancer diagnosis.     Assessment:     Patient was seen today and found to be pleasant and accompanied by her husband. Pts wt 122, ht 5'4'', and BMI 20.8. Pt was a good listener. Pts husband did not say anything. Pt states she has a problem being anemic.    Plan:     Dietitian educated the patient on implementing a plant based diet by incorporating more plant proteins, fruits, and vegetables. As a part of a healthy routine physical activity was discussed. Dietitian offered some food sources of iron. The importance of legitimate, evidence based information was discussed and examples were given..fol As a part of the continuum of care the cancer dietitian's contact information was given to the patient in the event they would like to have a follow up appointment.

## 2015-02-13 NOTE — Telephone Encounter (Signed)
I would recommend a Paraguard IUD now.  She can use condoms and let me know.  Best to place with cycle.

## 2015-02-13 NOTE — Telephone Encounter (Signed)
Left message to call Kaitlyn at [phone]. 

## 2015-02-13 NOTE — Telephone Encounter (Signed)
Spoke with patient. Advised of message as seen below from [place]. Patient states that she would like to wait to receive her genetics results to decide how she would like to move forward first. Will give our office a call if she would like to proceed with Paraguard IUD. Aware this will need to be inserted on her cycle. Will use BUM.  Routing to provider for final review. Patient agreeable to disposition. Will close encounter.

## 2015-02-18 ENCOUNTER — Telehealth: Payer: Self-pay | Admitting: *Deleted

## 2015-02-18 NOTE — Telephone Encounter (Signed)
Spoke to pt concerning [place] from 9.21.16. Denies questions or concerns regarding dx or treatment care plan. Relate appt with Dr. Migdalia Dk went well. She is going to wait for genetic testing results to make her final decision. Encourage pt to call with needs. Received verbal understanding. Contact information given.

## 2015-02-19 ENCOUNTER — Encounter: Payer: Self-pay | Admitting: Genetic Counselor

## 2015-02-19 ENCOUNTER — Telehealth: Payer: Self-pay | Admitting: Genetic Counselor

## 2015-02-19 DIAGNOSIS — Z1379 Encounter for other screening for genetic and chromosomal anomalies: Secondary | ICD-10-CM | POA: Insufficient documentation

## 2015-02-19 NOTE — Telephone Encounter (Signed)
Revealed that she had negative genetic testing on the breast/ovarian cancer panel.

## 2015-02-20 ENCOUNTER — Ambulatory Visit: Payer: Self-pay | Admitting: Genetic Counselor

## 2015-02-20 DIAGNOSIS — Z1379 Encounter for other screening for genetic and chromosomal anomalies: Secondary | ICD-10-CM

## 2015-02-20 DIAGNOSIS — C50311 Malignant neoplasm of lower-inner quadrant of right female breast: Secondary | ICD-10-CM

## 2015-02-20 NOTE — Progress Notes (Signed)
HPI: Ms. Schou was previously seen in the [place] clinic due to a personal history of cancer and concerns regarding a hereditary predisposition to cancer. Please refer to our prior cancer genetics clinic note for more information regarding Ms. Mayhall's medical, social and family histories, and our assessment and recommendations, at the time. Ms. Bruning recent genetic test results were disclosed to her, as were recommendations warranted by these results. These results and recommendations are discussed in more detail below.  FAMILY HISTORY:  We obtained a detailed, 4-generation family history.  Significant diagnoses are listed below: Family History  Problem Relation Age of Onset  . Diabetes Paternal Grandfather   . Heart attack Paternal Grandfather   . Colon cancer Maternal Grandfather 44    lived to 40  . Renal Disease Maternal Grandfather   . Breast cancer Other     MGF's mother dx over 17  . Lung cancer Maternal Uncle   . Stroke Maternal Grandmother     The patient has one daughter and is an only child. Her mother had one brother who was a smoker and had lung cancer, and a sister who is cancer free. Her maternal grandfather was diagnosed with colon cancer at 21 and died at 15. Her grandmother died of a stroke in his mid 60s. Her grandfather's mother had breast cancer. Ms. Ryser father had 8 brothers and sisters, none of whom had cancer. His parents died in their 42s. There is no other reported family history of cancer. Patient's maternal ancestors are of [place] and [place] descent, and paternal ancestors are of [place] descent. There is no reported Ashkenazi Jewish ancestry. There is no known consanguinity.  GENETIC TEST RESULTS: At the time of Ms. Lambe's visit, we recommended she pursue genetic testing of the Breast/Ovarian cancer gene panel. The Breast/Ovarian gene panel offered by [facility] includes sequencing and rearrangement analysis for the following 20 genes:  ATM,  BARD1, BRCA1, BRCA2, BRIP1, CDH1, CHEK2, EPCAM, FANCC, MLH1, MSH2, MSH6, NBN, PALB2, PMS2, PTEN, RAD51C, RAD51D, TP53, and XRCC2.   The report date is February 19, 2015.  Genetic testing was normal, and did not reveal a deleterious mutation in these genes. The test report has been scanned into EPIC and is located under the Molecular Pathology section of the Results Review tab.   We discussed with Ms. Fei that since the current genetic testing is not perfect, it is possible there may be a gene mutation in one of these genes that current testing cannot detect, but that chance is small. We also discussed, that it is possible that another gene that has not yet been discovered, or that we have not yet tested, is responsible for the cancer diagnoses in the family, and it is, therefore, important to remain in touch with cancer genetics in the future so that we can continue to offer Ms. Kok the most up to date genetic testing.   CANCER SCREENING RECOMMENDATIONS: This result is reassuring and indicates that Ms. Remund likely does not have an increased risk for a future cancer due to a mutation in one of these genes. This normal test also suggests that Ms. Ehly's cancer was most likely not due to an inherited predisposition associated with one of these genes.  Most cancers happen by chance and this negative test suggests that her cancer falls into this category.  We, therefore, recommended she continue to follow the cancer management and screening guidelines provided by her oncology and primary healthcare provider.   RECOMMENDATIONS FOR  FAMILY MEMBERS: Women in this family might be at some increased risk of developing cancer, over the general population risk, simply due to the family history of cancer. We recommended women in this family have a yearly mammogram beginning at age 68, or 61 years younger than the earliest onset of cancer, an an annual clinical breast exam, and perform monthly breast self-exams. Women  in this family should also have a gynecological exam as recommended by their primary provider. All family members should have a colonoscopy by age 40.  FOLLOW-UP: Lastly, we discussed with Ms. Garlick that cancer genetics is a rapidly advancing field and it is possible that new genetic tests will be appropriate for her and/or her family members in the future. We encouraged her to remain in contact with cancer genetics on an annual basis so we can update her personal and family histories and let her know of advances in cancer genetics that may benefit this family.   Our contact number was provided. Ms. Narvaiz questions were answered to her satisfaction, and she knows she is welcome to call us at anytime with additional questions or concerns.   Roma Kayser, MS, [facility] Certified Genetic Counselor [email or web address]

## 2015-02-24 ENCOUNTER — Other Ambulatory Visit: Payer: Self-pay | Admitting: Surgery

## 2015-02-24 DIAGNOSIS — D0591 Unspecified type of carcinoma in situ of right breast: Secondary | ICD-10-CM

## 2015-03-06 ENCOUNTER — Other Ambulatory Visit: Payer: Self-pay | Admitting: Plastic Surgery

## 2015-03-06 ENCOUNTER — Encounter: Payer: Self-pay | Admitting: *Deleted

## 2015-03-06 DIAGNOSIS — C50311 Malignant neoplasm of lower-inner quadrant of right female breast: Secondary | ICD-10-CM

## 2015-03-06 NOTE — H&P (Addendum)
Carolyn Berg is an 49 y.o. female.   Chief Complaint: right breast cancer HPI: The patient is a 49 yrs old wf here for a history and physical for breast reconstruction. She underwent a routine mammogram and was found to have right lower-inner quadrant calcifications (01/2015). 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 She is waiting on genetic results. At this time she is interested in a right mastectomy with immediate reconstruction. She is not a smoker. She is otherwise healthy. She is married and has a daughter (16 yrs). She is very fit, skinny and has very small breasts with no ptosis at all. The biopsy site is healing well. She is 5 feet 4 inches and weighs 122 pounds, Bra preop 34 A.   Past Medical History  Diagnosis Date  . Fibroids   . 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 Breast cancer     Past Surgical History  Procedure Laterality Date  . Finger fracture surgery      left hand   . Skin biopsy  1/15    basal cell, right thigh    Family History  Problem Relation Age of Onset  . Diabetes Paternal Grandfather   . Heart attack Paternal Grandfather   . Colon cancer Maternal Grandfather 44    lived to 38  . Renal Disease Maternal Grandfather   . Breast cancer Other     MGF's mother dx over 69  . Lung cancer Maternal Uncle   . Stroke Maternal Grandmother    Social History:  reports that she has never smoked. She has never used smokeless tobacco. She reports that she drinks alcohol. She reports that she does not use illicit drugs.  Allergies: No Known Allergies   (Not in a hospital admission)  No results found for this or any previous visit (from the past 48 hour(s)). No results found.  Review of Systems  Constitutional: Negative.   HENT: Negative.   Eyes: Negative.   Respiratory: Negative.   Cardiovascular: Negative.   Gastrointestinal: Negative.   Genitourinary: Negative.   Musculoskeletal: Negative.    Skin: Negative.   Neurological: Negative.   Psychiatric/Behavioral: Negative.     There were no vitals taken for this visit. Physical Exam  Constitutional: She is oriented to person, place, and time. She appears well-developed and well-nourished.  HENT:  Head: Normocephalic and atraumatic.  Eyes: Conjunctivae are normal. Pupils are equal, round, and reactive to light.  Neck: Normal range of motion.  Cardiovascular: Normal rate.   Respiratory: Effort normal.  GI: Soft.  Musculoskeletal: Normal range of motion.  Neurological: She is alert and oriented to person, place, and time.  Skin: Skin is warm.  Psychiatric: She has a normal mood and affect. Her behavior is normal. Judgment and thought content normal.     Assessment/Plan Once all reconstruction options were presented, a focused discussion was had regarding the patient's suitability for each of these procedures.  She is a good candidate for a right immediate breast reconstruction with an expander and FlexHD. She is in agreement.   Wallace Going 03/06/2015, 10:58 AM

## 2015-03-07 ENCOUNTER — Telehealth: Payer: Self-pay | Admitting: Hematology

## 2015-03-07 NOTE — Telephone Encounter (Signed)
lvm for pt regarding to NOV appt.. °

## 2015-03-11 ENCOUNTER — Encounter (HOSPITAL_COMMUNITY): Payer: Self-pay

## 2015-03-11 ENCOUNTER — Encounter (HOSPITAL_COMMUNITY)
Admission: RE | Admit: 2015-03-11 | Discharge: 2015-03-11 | Disposition: A | Discharge status: Home / Self Care | Payer: BLUE CROSS/BLUE SHIELD | Source: Ambulatory Visit | Attending: Surgery | Admitting: Surgery

## 2015-03-11 VITALS — BP 106/61 | HR 94 | Temp 98.9°F | Resp 18 | Ht 64.0 in | Wt 122.3 lb

## 2015-03-11 DIAGNOSIS — C50311 Malignant neoplasm of lower-inner quadrant of right female breast: Secondary | ICD-10-CM

## 2015-03-11 DIAGNOSIS — Z853 Personal history of malignant neoplasm of breast: Secondary | ICD-10-CM | POA: Diagnosis not present

## 2015-03-11 DIAGNOSIS — Z9012 Acquired absence of left breast and nipple: Secondary | ICD-10-CM | POA: Diagnosis not present

## 2015-03-11 DIAGNOSIS — Z803 Family history of malignant neoplasm of breast: Secondary | ICD-10-CM | POA: Diagnosis not present

## 2015-03-11 DIAGNOSIS — D0511 Intraductal carcinoma in situ of right breast: Secondary | ICD-10-CM | POA: Diagnosis not present

## 2015-03-11 HISTORY — DX: Anxiety disorder, unspecified: F41.9

## 2015-03-11 LAB — BASIC METABOLIC PANEL
Anion gap: 7 (ref 5–15)
BUN: 10 mg/dL (ref 6–20)
CALCIUM: 9 mg/dL (ref 8.9–10.3)
CO2: 27 mmol/L (ref 22–32)
CREATININE: 0.94 mg/dL (ref 0.44–1.00)
Chloride: 102 mmol/L (ref 101–111)
Glucose, Bld: 102 mg/dL — ABNORMAL HIGH (ref 65–99)
Potassium: 4 mmol/L (ref 3.5–5.1)
SODIUM: 136 mmol/L (ref 135–145)

## 2015-03-11 LAB — CBC
HCT: 35.3 % — ABNORMAL LOW (ref 36.0–46.0)
Hemoglobin: 11.9 g/dL — ABNORMAL LOW (ref 12.0–15.0)
MCH: 31.2 pg (ref 26.0–34.0)
MCHC: 33.7 g/dL (ref 30.0–36.0)
MCV: 92.7 fL (ref 78.0–100.0)
PLATELETS: 288 10*3/uL (ref 150–400)
RBC: 3.81 MIL/uL — AB (ref 3.87–5.11)
RDW: 13.1 % (ref 11.5–15.5)
WBC: 5.1 10*3/uL (ref 4.0–10.5)

## 2015-03-11 LAB — HCG, SERUM, QUALITATIVE: PREG SERUM: NEGATIVE

## 2015-03-11 NOTE — Pre-Procedure Instructions (Signed)
Carolyn Berg  03/11/2015      [id number] [place], [place] - [address] [place] [address] Phone: [phone] Fax: [phone]  [facility] [place] [place] [address] Phone: [phone] Fax: [phone]    Your procedure is scheduled on 03/13/2015.  Report to [facility] Admitting at 5:30 A.M.  Call this number if you have problems the morning of surgery:  [phone]   Remember:  Do not eat food or drink liquids after midnight.  On Wednesday              DO NOT TAKE ANY ASPIRIN  Or ANTI-INFLAMMATORY  MEDICINE   Take these medicines the morning of surgery with A SIP OF WATER : NONE   Do not wear jewelry, make-up or nail polish.   Do not wear lotions, powders, or perfumes.     Do not shave 48 hours prior to surgery.     Do not bring valuables to the hospital.   [facility] is not responsible for any belongings or valuables.  Contacts, dentures or bridgework may not be worn into surgery.  Leave your suitcase in the car.  After surgery it may be brought to your room.  For patients admitted to the hospital, discharge time will be determined by your treatment team.  Patients discharged the day of surgery will not be allowed to drive home.   Name and phone number of your driver:   /w family  Special instructions:  Special Instructions: [place] - Preparing for Surgery  Before surgery, you can play an important role.  Because skin is not sterile, your skin needs to be as free of germs as possible.  You can reduce the number of germs on you skin by washing with CHG (chlorahexidine gluconate) soap before surgery.  CHG is an antiseptic cleaner which kills germs and bonds with the skin to continue killing germs even after washing.  Please DO NOT use if you have an allergy to CHG or antibacterial soaps.  If your skin  becomes reddened/irritated stop using the CHG and inform your nurse when you arrive at Short Stay.  Do not shave (including legs and underarms) for at least 48 hours prior to the first CHG shower.  You may shave your face.  Please follow these instructions carefully:   1.  Shower with CHG Soap the night before surgery and the  morning of Surgery.  2.  If you choose to wash your hair, wash your hair first as usual with your  normal shampoo.  3.  After you shampoo, rinse your hair and body thoroughly to remove the  Shampoo.  4.  Use CHG as you would any other liquid soap.  You can apply chg directly to the skin and wash gently with scrungie or a clean washcloth.  5.  Apply the CHG Soap to your body ONLY FROM THE NECK DOWN.    Do not use on open wounds or open sores.  Avoid contact with your eyes, ears, mouth and genitals (private parts).  Wash genitals (private parts)   with your normal soap.  6.  Wash thoroughly, paying special attention to the area where your surgery will be performed.  7.  Thoroughly rinse your body with warm water from the neck down.  8.  DO NOT shower/wash with your normal soap after using  and rinsing off   the CHG Soap.  9.  Pat yourself dry with a clean towel.            10.  Wear clean pajamas.            11.  Place clean sheets on your bed the night of your first shower and do not sleep with pets.  Day of Surgery  Do not apply any lotions/deodorants the morning of surgery.  Please wear clean clothes to the hospital/surgery center.  Please read over the following fact sheets that you were given. Pain Booklet, Coughing and Deep Breathing and Surgical Site Infection Prevention

## 2015-03-11 NOTE — Progress Notes (Signed)
Pt. Denies any chest or cardiac related problems,  No cardiac testing in the past.

## 2015-03-12 NOTE — Anesthesia Preprocedure Evaluation (Addendum)
Anesthesia Evaluation  Patient identified by MRN, date of birth, ID band Patient awake    Reviewed: Allergy & Precautions, NPO status , Patient's Chart, lab work & pertinent test results  Airway Mallampati: II  TM Distance: >3 FB Neck ROM: Full    Dental  (+) Teeth Intact, Dental Advisory Given   Pulmonary neg pulmonary ROS,    Pulmonary exam normal        Cardiovascular negative cardio ROS Normal cardiovascular exam Rhythm:Regular Rate:Tachycardia     Neuro/Psych PSYCHIATRIC DISORDERS Anxiety negative neurological ROS     GI/Hepatic negative GI ROS, Neg liver ROS,   Endo/Other  negative endocrine ROS  Renal/GU negative Renal ROS     Musculoskeletal   Abdominal   Peds  Hematology   Anesthesia Other Findings   Reproductive/Obstetrics                           Anesthesia Physical Anesthesia Plan  ASA: II  Anesthesia Plan: General   Post-op Pain Management: GA combined w/ Regional for post-op pain   Induction: Intravenous  Airway Management Planned: LMA  Additional Equipment:   Intra-op Plan:   Post-operative Plan: Extubation in OR  Informed Consent: I have reviewed the patients History and Physical, chart, labs and discussed the procedure including the risks, benefits and alternatives for the proposed anesthesia with the patient or authorized representative who has indicated his/her understanding and acceptance.   Dental advisory given  Plan Discussed with: CRNA, Anesthesiologist and Surgeon  Anesthesia Plan Comments:        Anesthesia Quick Evaluation

## 2015-03-12 NOTE — H&P (Signed)
[place]  Location: [facility] Patient #: [id number] DOB: [birth year] Married / Language: English / Race: White Female   History of Present Illness   Patient words: discuss surgery.   The patient is a 49 year old female who presents with breast cancer.   Her PCP is Dr. Mady Haagensen.  She was seen at the multidisciplinary breast cancer clinic. Her oncologist are Dr. Burr Medico and Dr. Pablo Ledger. Sees Dr. Ammie Ferrier for GYN. I think that she leans on Dr. Sabra Heck for most of her care  She comes by herself. He husband is riding the [place] bike ride.   Ms. Tedder comes back to talk about the options for surgery. She has a much better handle on the findings and options and had good questions. I spent over 30 minutes with her reviewing the surgery, the recovery, and the follow up. I think the main question is how much of this is DCIS and if there is invasive cancer, which could change the post op treatment.   She has met with Dr. Migdalia Dk - and though she has some more questions for her - is comfortable with the planned reconstruction.  She is ready to go ahead with surgery and I will schedule this for her.  History of breast cancer (01/2015) She had her normal annual mammogram on 01/31/2015 at [facility]. She had a new group of microca++ at the 4 o'clock position.  She underwent a mammogram at [facility] that showed abnormal calcification in her right breast. She underwent a right breast biopsy. The biopsy accession: [id number] - showed ductal carcinoma in situ. She underwent an MRI on 02/10/2015. This showed a 5.2 x 5.0 cm non-mass enhancement involving the majority of the lower outer quadrant of the right breast.  Her great grandmother had breast cancer. She is still having periods and is on her period now. She is on BCP. I recommended that she stop the BCP.  I discussed the options for breast cancer treatment with the patient. I discussed a multidisciplinary  approach to the treatment of breast cancer, which includes medical oncology and radiation oncology. I discussed the surgical options of lumpectomy vs. mastectomy. If mastectomy, there is the possibility of reconstruction. I discussed the options of lymph node biopsy. The treatment plan depends on the pathologic staging of the tumor and the patient's personal wishes. The risks of surgery include, but are not limited to, bleeding, infection, the need for further surgery, and nerve injury. The patient has been given literature on the treatment of breast cancer.  Past Medical History: She gets biometrics through [place] at [facility]. She is very healthy otherwise.  Social History. Her husband, Herbie Baltimore, is with her. She has one daughter, 48 yo. She is a Agricultural engineer.    Other Problems Shann Medal, MD; 02/24/2015 4:18 PM) Breast Cancer Lump In Breast  Past Surgical History Shann Medal, MD; 02/24/2015 4:18 PM) Breast Biopsy Right. Oral Surgery  Allergies (Ammie Eversole, LPN; 92/05/1939 7:40 PM) No Known Drug Allergies10/07/2014  Medication History (Ammie Eversole, LPN; 81/08/4816 5:63 PM) No Current Medications  Vitals (Ammie Eversole LPN; 14/01/7025 3:78 PM) 02/24/2015 3:41 PM Weight: 121.4 lb Height: 64in Body Surface Area: 1.58 m Body Mass Index: 20.84 kg/m  Temp.: 98.25F(Oral)  Pulse: 72 (Regular)  BP: 100/66 (Sitting, Left Arm, Standard)   Physical Exam  General: WN WF who is alert and generally healthy appearing. HEENT: Normal. Pupils equal.  Neck: Supple. No mass. No thyroid mass. Lymph Nodes: No supraclavicular, cervical or  axillary nodes.  Lungs: Clear to auscultation and symmetric breath sounds. Heart: RRR. No murmur or rub.  Breasts: Right - bruise at 7 o'clock. I do not feel a mass  Left - no mass  Abdomen: Soft. No mass. No tenderness. No hernia. Normal bowel sounds. No abdominal scars.  Extremities: Good  strength and ROM in upper and lower extremities.  Neurologic: Grossly intact to motor and sensory function. Psychiatric: Has normal mood and affect. Behavior is normal.  Assessment & Plan  1.  BREAST CANCER, STAGE 0, RIGHT (D05.91)  Story: Right breast biopsy on 02/06/2015 - accession:  [id number] showed ductal carcinoma in situ. ER/PR reported as positive  She underwent an MRI on 02/10/2015. This showed a 5.2 x 5.0 cm non-mass enhancement involving the majority of the lower outer quadrant of the right breast.   Oncology - Burr Medico and Pablo Ledger  Impression: Plan:   Right mastectomy with right axillary sentinel lymph node biopsy (because of proximity to the nipple, will plan skin sparing, but not nipple sparing mastectomy)   Reconstruction by Dr. Jenetta Loges, MD, [facility] Pager: [phone] Office phone:  [phone]

## 2015-03-13 ENCOUNTER — Inpatient Hospital Stay (HOSPITAL_COMMUNITY): Payer: BLUE CROSS/BLUE SHIELD | Admitting: Anesthesiology

## 2015-03-13 ENCOUNTER — Inpatient Hospital Stay (HOSPITAL_COMMUNITY)
Admission: RE | Admit: 2015-03-13 | Discharge: 2015-03-13 | Disposition: A | Discharge status: Home / Self Care | Payer: BLUE CROSS/BLUE SHIELD | Source: Ambulatory Visit | Attending: Surgery | Admitting: Surgery

## 2015-03-13 ENCOUNTER — Encounter (HOSPITAL_COMMUNITY)
Admission: RE | Disposition: A | Discharge status: Home / Self Care | Payer: Self-pay | Source: Ambulatory Visit | Attending: Plastic Surgery

## 2015-03-13 ENCOUNTER — Observation Stay (HOSPITAL_COMMUNITY)
Admission: RE | Admit: 2015-03-13 | Discharge: 2015-03-14 | Disposition: A | Discharge status: Home / Self Care | Payer: BLUE CROSS/BLUE SHIELD | Source: Ambulatory Visit | Attending: Plastic Surgery | Admitting: Plastic Surgery

## 2015-03-13 ENCOUNTER — Encounter (HOSPITAL_COMMUNITY): Payer: Self-pay | Admitting: *Deleted

## 2015-03-13 DIAGNOSIS — Z853 Personal history of malignant neoplasm of breast: Secondary | ICD-10-CM | POA: Insufficient documentation

## 2015-03-13 DIAGNOSIS — D0511 Intraductal carcinoma in situ of right breast: Secondary | ICD-10-CM | POA: Diagnosis not present

## 2015-03-13 DIAGNOSIS — C50311 Malignant neoplasm of lower-inner quadrant of right female breast: Secondary | ICD-10-CM

## 2015-03-13 DIAGNOSIS — D0591 Unspecified type of carcinoma in situ of right breast: Secondary | ICD-10-CM

## 2015-03-13 DIAGNOSIS — Z803 Family history of malignant neoplasm of breast: Secondary | ICD-10-CM | POA: Insufficient documentation

## 2015-03-13 DIAGNOSIS — Z9012 Acquired absence of left breast and nipple: Secondary | ICD-10-CM | POA: Insufficient documentation

## 2015-03-13 HISTORY — PX: MASTECTOMY COMPLETE / SIMPLE W/ SENTINEL NODE BIOPSY: SUR846

## 2015-03-13 HISTORY — PX: PARTIAL MASTECTOMY WITH AXILLARY SENTINEL LYMPH NODE BIOPSY: SHX6004

## 2015-03-13 HISTORY — PX: BREAST RECONSTRUCTION WITH PLACEMENT OF TISSUE EXPANDER AND FLEX HD (ACELLULAR HYDRATED DERMIS): SHX6295

## 2015-03-13 HISTORY — DX: Basal cell carcinoma of skin of right lower limb, including hip: C44.712

## 2015-03-13 LAB — BASIC METABOLIC PANEL
ANION GAP: 8 (ref 5–15)
BUN: 7 mg/dL (ref 6–20)
CALCIUM: 8.6 mg/dL — AB (ref 8.9–10.3)
CO2: 24 mmol/L (ref 22–32)
CREATININE: 0.97 mg/dL (ref 0.44–1.00)
Chloride: 102 mmol/L (ref 101–111)
GLUCOSE: 164 mg/dL — AB (ref 65–99)
Potassium: 4.5 mmol/L (ref 3.5–5.1)
Sodium: 134 mmol/L — ABNORMAL LOW (ref 135–145)

## 2015-03-13 LAB — CBC
HCT: 33.1 % — ABNORMAL LOW (ref 36.0–46.0)
HEMOGLOBIN: 11 g/dL — AB (ref 12.0–15.0)
MCH: 30.7 pg (ref 26.0–34.0)
MCHC: 33.2 g/dL (ref 30.0–36.0)
MCV: 92.5 fL (ref 78.0–100.0)
PLATELETS: 292 10*3/uL (ref 150–400)
RBC: 3.58 MIL/uL — AB (ref 3.87–5.11)
RDW: 13 % (ref 11.5–15.5)
WBC: 11.4 10*3/uL — ABNORMAL HIGH (ref 4.0–10.5)

## 2015-03-13 SURGERY — PARTIAL MASTECTOMY WITH AXILLARY SENTINEL LYMPH NODE BIOPSY
Anesthesia: General | Site: Breast | Laterality: Right

## 2015-03-13 MED ORDER — 0.9 % SODIUM CHLORIDE (POUR BTL) OPTIME
TOPICAL | Status: DC | PRN
  Administered 2015-03-13 (×3): 1000 mL

## 2015-03-13 MED ORDER — SODIUM CHLORIDE 0.9 % IR SOLN
Status: DC | PRN
  Administered 2015-03-13: 500 mL

## 2015-03-13 MED ORDER — PROMETHAZINE HCL 25 MG/ML IJ SOLN: 6.2500 mg | INTRAMUSCULAR | Status: DC | PRN

## 2015-03-13 MED ORDER — SODIUM CHLORIDE 0.9 % IR SOLN
Status: DC
  Filled 2015-03-13: qty 1

## 2015-03-13 MED ORDER — DEXAMETHASONE SODIUM PHOSPHATE 4 MG/ML IJ SOLN
INTRAMUSCULAR | Status: DC | PRN
Start: 2015-03-13 — End: 2015-03-13
  Administered 2015-03-13: 4 mg via INTRAVENOUS

## 2015-03-13 MED ORDER — CHLORHEXIDINE GLUCONATE 4 % EX LIQD: 1.0000 "application " | Freq: Once | CUTANEOUS | Status: DC

## 2015-03-13 MED ORDER — ONDANSETRON 4 MG PO TBDP: 4.0000 mg | ORAL_TABLET | Freq: Four times a day (QID) | ORAL | Status: DC | PRN

## 2015-03-13 MED ORDER — PROPOFOL 10 MG/ML IV BOLUS
INTRAVENOUS | Status: AC
  Filled 2015-03-13: qty 20

## 2015-03-13 MED ORDER — PHENYLEPHRINE HCL 10 MG/ML IJ SOLN
INTRAMUSCULAR | Status: DC | PRN
  Administered 2015-03-13 (×2): 120 ug via INTRAVENOUS
  Administered 2015-03-13: 80 ug via INTRAVENOUS

## 2015-03-13 MED ORDER — CEFAZOLIN SODIUM-DEXTROSE 2-3 GM-% IV SOLR
2.0000 g | INTRAVENOUS | Status: AC
  Administered 2015-03-13: 2 g via INTRAVENOUS
  Filled 2015-03-13: qty 50

## 2015-03-13 MED ORDER — EPHEDRINE SULFATE 50 MG/ML IJ SOLN
INTRAMUSCULAR | Status: AC
  Filled 2015-03-13: qty 1

## 2015-03-13 MED ORDER — DIPHENHYDRAMINE HCL 50 MG/ML IJ SOLN
INTRAMUSCULAR | Status: DC | PRN
  Administered 2015-03-13: 12.5 mg via INTRAVENOUS

## 2015-03-13 MED ORDER — PHENYLEPHRINE 40 MCG/ML (10ML) SYRINGE FOR IV PUSH (FOR BLOOD PRESSURE SUPPORT)
PREFILLED_SYRINGE | INTRAVENOUS | Status: AC
  Filled 2015-03-13: qty 10

## 2015-03-13 MED ORDER — PROPOFOL 10 MG/ML IV BOLUS
INTRAVENOUS | Status: DC | PRN
  Administered 2015-03-13: 160 mg via INTRAVENOUS

## 2015-03-13 MED ORDER — ROCURONIUM BROMIDE 50 MG/5ML IV SOLN
INTRAVENOUS | Status: AC
  Filled 2015-03-13: qty 1

## 2015-03-13 MED ORDER — NAPROXEN 250 MG PO TABS: 500.0000 mg | ORAL_TABLET | Freq: Two times a day (BID) | ORAL | Status: DC | PRN

## 2015-03-13 MED ORDER — ONDANSETRON HCL 4 MG/2ML IJ SOLN: 4.0000 mg | Freq: Four times a day (QID) | INTRAMUSCULAR | Status: DC | PRN

## 2015-03-13 MED ORDER — FENTANYL CITRATE (PF) 250 MCG/5ML IJ SOLN
INTRAMUSCULAR | Status: AC
  Filled 2015-03-13: qty 5

## 2015-03-13 MED ORDER — HYDROMORPHONE HCL 1 MG/ML IJ SOLN: 0.5000 mg | INTRAMUSCULAR | Status: DC | PRN

## 2015-03-13 MED ORDER — CEFAZOLIN SODIUM-DEXTROSE 2-3 GM-% IV SOLR: 2.0000 g | INTRAVENOUS | Status: DC

## 2015-03-13 MED ORDER — ONDANSETRON HCL 4 MG/2ML IJ SOLN
INTRAMUSCULAR | Status: DC | PRN
  Administered 2015-03-13: 4 mg via INTRAVENOUS

## 2015-03-13 MED ORDER — HYDROMORPHONE HCL 1 MG/ML IJ SOLN
INTRAMUSCULAR | Status: AC
  Filled 2015-03-13: qty 1

## 2015-03-13 MED ORDER — ONDANSETRON HCL 4 MG/2ML IJ SOLN
INTRAMUSCULAR | Status: AC
  Filled 2015-03-13: qty 2

## 2015-03-13 MED ORDER — BUPIVACAINE-EPINEPHRINE (PF) 0.5% -1:200000 IJ SOLN
INTRAMUSCULAR | Status: DC | PRN
  Administered 2015-03-13: 30 mL

## 2015-03-13 MED ORDER — HYDROMORPHONE HCL 1 MG/ML IJ SOLN: 0.2500 mg | INTRAMUSCULAR | Status: DC | PRN

## 2015-03-13 MED ORDER — EPHEDRINE SULFATE 50 MG/ML IJ SOLN
INTRAMUSCULAR | Status: DC | PRN
  Administered 2015-03-13: 15 mg via INTRAVENOUS

## 2015-03-13 MED ORDER — POLYETHYLENE GLYCOL 3350 17 G PO PACK
17.0000 g | PACK | Freq: Every day | ORAL | Status: DC
  Filled 2015-03-13: qty 1

## 2015-03-13 MED ORDER — DIPHENHYDRAMINE HCL 50 MG/ML IJ SOLN
INTRAMUSCULAR | Status: AC
  Filled 2015-03-13: qty 1

## 2015-03-13 MED ORDER — DIAZEPAM 2 MG PO TABS
2.0000 mg | ORAL_TABLET | Freq: Two times a day (BID) | ORAL | Status: DC
  Administered 2015-03-13 – 2015-03-14 (×3): 2 mg via ORAL
  Filled 2015-03-13 (×4): qty 1

## 2015-03-13 MED ORDER — TECHNETIUM TC 99M SULFUR COLLOID FILTERED: 1.0000 | Freq: Once | INTRAVENOUS | Status: DC | PRN

## 2015-03-13 MED ORDER — ACETAMINOPHEN 500 MG PO TABS
1000.0000 mg | ORAL_TABLET | Freq: Four times a day (QID) | ORAL | Status: DC
Start: 2015-03-13 — End: 2015-03-14
  Administered 2015-03-13 – 2015-03-14 (×3): 1000 mg via ORAL
  Filled 2015-03-13 (×4): qty 2

## 2015-03-13 MED ORDER — SCOPOLAMINE 1 MG/3DAYS TD PT72SCOPOLAMINE 1 MG/3DAYS
1.0000 | MEDICATED_PATCH | TRANSDERMAL | Status: DC
Start: 2015-03-13 — End: 2015-03-13
  Administered 2015-03-13: 1 via TRANSDERMAL
  Administered 2015-03-13: 1.5 mg via TRANSDERMAL
  Filled 2015-03-13: qty 1

## 2015-03-13 MED ORDER — DIPHENHYDRAMINE HCL 12.5 MG/5ML PO ELIX: 12.5000 mg | ORAL_SOLUTION | Freq: Four times a day (QID) | ORAL | Status: DC | PRN

## 2015-03-13 MED ORDER — DIPHENHYDRAMINE HCL 50 MG/ML IJ SOLN: 12.5000 mg | Freq: Four times a day (QID) | INTRAMUSCULAR | Status: DC | PRN

## 2015-03-13 MED ORDER — FENTANYL CITRATE (PF) 100 MCG/2ML IJ SOLN
INTRAMUSCULAR | Status: DC | PRN
  Administered 2015-03-13 (×5): 50 ug via INTRAVENOUS

## 2015-03-13 MED ORDER — LACTATED RINGERS IV SOLN
INTRAVENOUS | Status: DC | PRN
  Administered 2015-03-13 (×2): via INTRAVENOUS

## 2015-03-13 MED ORDER — HYDROMORPHONE HCL 1 MG/ML IJ SOLN
0.2500 mg | INTRAMUSCULAR | Status: DC | PRN
  Administered 2015-03-13 (×2): 0.5 mg via INTRAVENOUS

## 2015-03-13 MED ORDER — CEFAZOLIN SODIUM-DEXTROSE 2-3 GM-% IV SOLR
2.0000 g | Freq: Three times a day (TID) | INTRAVENOUS | Status: DC
  Administered 2015-03-13 – 2015-03-14 (×3): 2 g via INTRAVENOUS
  Filled 2015-03-13 (×6): qty 50

## 2015-03-13 MED ORDER — MIDAZOLAM HCL 2 MG/2ML IJ SOLN
INTRAMUSCULAR | Status: AC
  Filled 2015-03-13: qty 4

## 2015-03-13 MED ORDER — LIDOCAINE HCL (CARDIAC) 20 MG/ML IV SOLN
INTRAVENOUS | Status: AC
  Filled 2015-03-13: qty 5

## 2015-03-13 MED ORDER — MIDAZOLAM HCL 5 MG/5ML IJ SOLN
INTRAMUSCULAR | Status: DC | PRN
  Administered 2015-03-13: 2 mg via INTRAVENOUS

## 2015-03-13 MED ORDER — HYDROCODONE-ACETAMINOPHEN 5-325 MG PO TABS
1.0000 | ORAL_TABLET | ORAL | Status: DC | PRN
  Administered 2015-03-14: 1 via ORAL
  Filled 2015-03-13: qty 1
  Filled 2015-03-13: qty 2

## 2015-03-13 MED ORDER — SODIUM CHLORIDE 0.9 % IJ SOLN
INTRAMUSCULAR | Status: AC
  Filled 2015-03-13: qty 10

## 2015-03-13 MED ORDER — DEXAMETHASONE SODIUM PHOSPHATE 4 MG/ML IJ SOLN
INTRAMUSCULAR | Status: AC
  Filled 2015-03-13: qty 1

## 2015-03-13 MED ORDER — SUCCINYLCHOLINE CHLORIDE 20 MG/ML IJ SOLN
INTRAMUSCULAR | Status: AC
  Filled 2015-03-13: qty 1

## 2015-03-13 SURGICAL SUPPLY — 90 items
ADH SKN CLS APL DERMABOND .7 (GAUZE/BANDAGES/DRESSINGS)
APPLIER CLIP 9.375 MED OPEN (MISCELLANEOUS) ×2
APPLIER CLIP 9.375 SM OPEN (CLIP)
APR CLP MED 9.3 20 MLT OPN (MISCELLANEOUS) ×1
APR CLP SM 9.3 20 MLT OPN (CLIP)
BAG DECANTER FOR FLEXI CONT (MISCELLANEOUS) ×2 IMPLANT
BINDER BREAST LRG (GAUZE/BANDAGES/DRESSINGS) IMPLANT
BINDER BREAST MEDIUM (GAUZE/BANDAGES/DRESSINGS) ×1 IMPLANT
BINDER BREAST XLRG (GAUZE/BANDAGES/DRESSINGS) IMPLANT
BIOPATCH RED 1 DISK 7.0 (GAUZE/BANDAGES/DRESSINGS) ×3 IMPLANT
BLADE 10 SAFETY STRL DISP (BLADE) ×1 IMPLANT
CANISTER SUCTION 2500CC (MISCELLANEOUS) ×4 IMPLANT
CHLORAPREP W/TINT 26ML (MISCELLANEOUS) ×4 IMPLANT
CLIP APPLIE 9.375 MED OPEN (MISCELLANEOUS) ×1 IMPLANT
CLIP APPLIE 9.375 SM OPEN (CLIP) IMPLANT
CLIP TI WIDE RED SMALL 6 (CLIP) ×1 IMPLANT
CONT SPEC 4OZ CLIKSEAL STRL BL (MISCELLANEOUS) ×3 IMPLANT
COVER PROBE W GEL 5X96 (DRAPES) ×2 IMPLANT
COVER SURGICAL LIGHT HANDLE (MISCELLANEOUS) ×4 IMPLANT
DERMABOND ADVANCED (GAUZE/BANDAGES/DRESSINGS)
DERMABOND ADVANCED .7 DNX12 (GAUZE/BANDAGES/DRESSINGS) ×1 IMPLANT
DEVICE DISSECT PLASMABLAD 3.0S (MISCELLANEOUS) IMPLANT
DEVICE DUBIN SPECIMEN MAMMOGRA (MISCELLANEOUS) ×1 IMPLANT
DRAIN CHANNEL 19F RND (DRAIN) ×2 IMPLANT
DRAPE CHEST BREAST 15X10 FENES (DRAPES) ×1 IMPLANT
DRAPE ORTHO SPLIT 77X108 STRL (DRAPES) ×4
DRAPE PROXIMA HALF (DRAPES) ×3 IMPLANT
DRAPE SURG 17X23 STRL (DRAPES) ×4 IMPLANT
DRAPE SURG ORHT 6 SPLT 77X108 (DRAPES) ×2 IMPLANT
DRAPE UTILITY XL STRL (DRAPES) ×3 IMPLANT
DRAPE WARM FLUID 44X44 (DRAPE) ×2 IMPLANT
DRSG PAD ABDOMINAL 8X10 ST (GAUZE/BANDAGES/DRESSINGS) ×1 IMPLANT
ELECT BLADE 4.0 EZ CLEAN MEGAD (MISCELLANEOUS) ×2
ELECT CAUTERY BLADE 6.4 (BLADE) ×1 IMPLANT
ELECT COATED BLADE 2.86 ST (ELECTRODE) ×2 IMPLANT
ELECT REM PT RETURN 9FT ADLT (ELECTROSURGICAL) ×4
ELECTRODE BLDE 4.0 EZ CLN MEGD (MISCELLANEOUS) ×1 IMPLANT
ELECTRODE REM PT RTRN 9FT ADLT (ELECTROSURGICAL) ×2 IMPLANT
EVACUATOR SILICONE 100CC (DRAIN) ×2 IMPLANT
EXPANDER BREAST 275CC (Breast) ×1 IMPLANT
GAUZE SPONGE 4X4 12PLY STRL (GAUZE/BANDAGES/DRESSINGS) ×1 IMPLANT
GLOVE BIO SURGEON STRL SZ 6.5 (GLOVE) ×4 IMPLANT
GLOVE BIO SURGEON STRL SZ7 (GLOVE) ×3 IMPLANT
GLOVE BIOGEL PI IND STRL 7.0 (GLOVE) IMPLANT
GLOVE BIOGEL PI IND STRL 8 (GLOVE) IMPLANT
GLOVE BIOGEL PI INDICATOR 7.0 (GLOVE) ×4
GLOVE BIOGEL PI INDICATOR 8 (GLOVE) ×1
GLOVE SURG SIGNA 7.5 PF LTX (GLOVE) ×2 IMPLANT
GLOVE SURG SS PI 7.0 STRL IVOR (GLOVE) ×3 IMPLANT
GOWN STRL REUS W/ TWL LRG LVL3 (GOWN DISPOSABLE) ×3 IMPLANT
GOWN STRL REUS W/ TWL XL LVL3 (GOWN DISPOSABLE) ×1 IMPLANT
GOWN STRL REUS W/TWL LRG LVL3 (GOWN DISPOSABLE) ×6
GOWN STRL REUS W/TWL XL LVL3 (GOWN DISPOSABLE) ×2
GRAFT FLEX HD PLIA 4X16 THICK (Tissue) ×1 IMPLANT
KIT BASIN OR (CUSTOM PROCEDURE TRAY) ×4 IMPLANT
KIT MARKER MARGIN INK (KITS) ×2 IMPLANT
KIT ROOM TURNOVER OR (KITS) ×4 IMPLANT
LIQUID BAND (GAUZE/BANDAGES/DRESSINGS) ×2 IMPLANT
NDL 18GX1X1/2 (RX/OR ONLY) (NEEDLE) ×1 IMPLANT
NDL HYPO 25GX1X1/2 BEV (NEEDLE) ×2 IMPLANT
NDL INFUSION SET 21GA (NEEDLE) IMPLANT
NEEDLE 18GX1X1/2 (RX/OR ONLY) (NEEDLE) IMPLANT
NEEDLE 22X1 1/2 (OR ONLY) (NEEDLE) ×1 IMPLANT
NEEDLE HYPO 25GX1X1/2 BEV (NEEDLE) IMPLANT
NEEDLE INFUSION SET 21GA (NEEDLE) ×2 IMPLANT
NS IRRIG 1000ML POUR BTL (IV SOLUTION) ×8 IMPLANT
PACK GENERAL/GYN (CUSTOM PROCEDURE TRAY) ×4 IMPLANT
PAD ABD 8X10 STRL (GAUZE/BANDAGES/DRESSINGS) ×2 IMPLANT
PAD ARMBOARD 7.5X6 YLW CONV (MISCELLANEOUS) ×3 IMPLANT
PIN SAFETY STERILE (MISCELLANEOUS) ×2 IMPLANT
PLASMABLADE 3.0S (MISCELLANEOUS) ×2
SET ASEPTIC TRANSFER (MISCELLANEOUS) ×1 IMPLANT
SPONGE GAUZE 4X4 12PLY STER LF (GAUZE/BANDAGES/DRESSINGS) ×1 IMPLANT
STAPLER VISISTAT 35W (STAPLE) IMPLANT
SUT MNCRL AB 3-0 PS2 18 (SUTURE) ×1 IMPLANT
SUT MNCRL AB 4-0 PS2 18 (SUTURE) ×2 IMPLANT
SUT MON AB 3-0 SH 27 (SUTURE) ×2
SUT MON AB 3-0 SH27 (SUTURE) ×1 IMPLANT
SUT MON AB 5-0 PS2 18 (SUTURE) ×3 IMPLANT
SUT PDS AB 2-0 CT1 27 (SUTURE) ×7 IMPLANT
SUT SILK 3 0 SH 30 (SUTURE) ×2 IMPLANT
SUT SILK 4 0 PS 2 (SUTURE) ×1 IMPLANT
SUT VIC AB 3-0 SH 18 (SUTURE) ×2 IMPLANT
SUT VIC AB 3-0 SH 27 (SUTURE) ×2
SUT VIC AB 3-0 SH 27XBRD (SUTURE) IMPLANT
SYR CONTROL 10ML LL (SYRINGE) ×3 IMPLANT
TOWEL OR 17X24 6PK STRL BLUE (TOWEL DISPOSABLE) ×4 IMPLANT
TOWEL OR 17X26 10 PK STRL BLUE (TOWEL DISPOSABLE) ×4 IMPLANT
TRAY FOLEY CATH 14FRSI W/METER (CATHETERS) IMPLANT
TUBE CONNECTING 12X1/4 (SUCTIONS) ×1 IMPLANT

## 2015-03-13 NOTE — Op Note (Addendum)
Op report   DATE OF OPERATION:  03/13/2015  LOCATION: [facility]  SURGICAL DIVISION: Plastic Surgery  PREOPERATIVE DIAGNOSES:  1. Left breast cancer.  2. Acquired absence of left breast.   POSTOPERATIVE DIAGNOSES:  1. Left breast cancer.  2. Acquired absence of left breast.   PROCEDURE:  1. Left Breast immediate reconstruction with expander and FlexHD placement  SURGEON: Theodoro Kos, DO  ASSISTANT: Erlinda Hong, PA  ANESTHESIA:  General.   COMPLICATIONS: None.   IMPLANTS: Mentor Expander 50 cc placed in 275 cc   INDICATIONS FOR PROCEDURE:  The patient, Carolyn Berg, is a 49 y.o. female born on [birth year], is here for treatment of breast cancer on the left.  She decided to undergo a mastectomy with reconstruction.  We are planning on immediate reconstruction with expander and acellular dermal matrix. MRN: [id number]  CONSENT:  Informed consent was obtained directly from the patient. Risks, benefits and alternatives were fully discussed. Specific risks including but not limited to bleeding, infection, hematoma, seroma, scarring, pain, infection, implant extrusion, capsular contracture, asymmetry, wound healing problems, and need for further surgery were all discussed. The patient did have an ample opportunity to have her questions answered to her satisfaction.   DESCRIPTION OF PROCEDURE:  The patient was taken to the operating room. SCDs were placed and IV antibiotics were given. General surgery underwent a left mastectomy with SLN biopsy. Once complete the patient was rendered to the Plastic surgery team.  A time out was performed and the expanders to be used were identified.  The pocket was irrigated with antibiotic solution.  Hemostasis was achieved with electrocautery.  The bovie was used to lift the pectoralis muscle from the chest wall to create a pocket for the expander.  The FlexHD was prepared according to the package guidelines.  It was sutured to the  edge of the pectoralis major muscle with the 2-0 PDS.  New gloves were placed. The expander was then placed under the pectoralis and Flex.  The air was evacuated and it was filled with 50 cc of injectable normal saline.   The Flex was trimmed and then sutured to the inframammary fold with 2-0 PDS.   Hemostasis was ensured with electrocautery.  The pocket was irrigated with antibiotic solution.   A JP drain was placed and secured to the skin with 4-0 Silk.  The skin was closed with 4-0 Monocryl deep dermal and 5-0 Monocryl subcuticular stitches.  Dermabond was applied.  A breast binder and ABD was applied.  The patient was allowed to wake from anesthesia and taken to the recovery room in satisfactory condition.

## 2015-03-13 NOTE — Anesthesia Postprocedure Evaluation (Signed)
  Anesthesia Post-op Note  Patient: Carolyn Berg  Procedure(s) Performed: Procedure(s): RIGHT MASTECTOMY WITH AXILLARY SENTINEL LYMPH NODE BIOPSY (Right) IMMEDIATE RIGHT BREAST RECONSTRUCTION WITH PLACEMENT OF TISSUE EXPANDER AND FLEX PLIABLE 4X16  (Right)  Patient Location: PACU  Anesthesia Type:General  Level of Consciousness: awake, alert  and oriented  Airway and Oxygen Therapy: Patient Spontanous Breathing and Patient connected to nasal cannula oxygen  Post-op Pain: mild  Post-op Assessment: Post-op Vital signs reviewed, Patient's Cardiovascular Status Stable, Respiratory Function Stable, Patent Airway and Pain level controlled              Post-op Vital Signs: stable  Last Vitals:  Filed Vitals:   03/13/15 1200  BP:   Pulse:   Temp: 36.3 C  Resp:     Complications: No apparent anesthesia complications

## 2015-03-13 NOTE — H&P (View-Only) (Signed)
Carolyn Berg is an 49 y.o. female.   Chief Complaint: right breast cancer HPI: The patient is a 49 yrs old wf here for a history and physical for breast reconstruction. She underwent a routine mammogram and was found to have right lower-inner quadrant calcifications (01/2015). 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 She is waiting on genetic results. At this time she is interested in a right mastectomy with immediate reconstruction. She is not a smoker. She is otherwise healthy. She is married and has a daughter (16 yrs). She is very fit, skinny and has very small breasts with no ptosis at all. The biopsy site is healing well. She is 5 feet 4 inches and weighs 122 pounds, Bra preop 34 A.   Past Medical History  Diagnosis Date  . Fibroids   . 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 Breast cancer     Past Surgical History  Procedure Laterality Date  . Finger fracture surgery      left hand   . Skin biopsy  1/15    basal cell, right thigh    Family History  Problem Relation Age of Onset  . Diabetes Paternal Grandfather   . Heart attack Paternal Grandfather   . Colon cancer Maternal Grandfather 60    lived to 32  . Renal Disease Maternal Grandfather   . Breast cancer Other     MGF's mother dx over 40  . Lung cancer Maternal Uncle   . Stroke Maternal Grandmother    Social History:  reports that she has never smoked. She has never used smokeless tobacco. She reports that she drinks alcohol. She reports that she does not use illicit drugs.  Allergies: No Known Allergies   (Not in a hospital admission)  No results found for this or any previous visit (from the past 48 hour(s)). No results found.  Review of Systems  Constitutional: Negative.   HENT: Negative.   Eyes: Negative.   Respiratory: Negative.   Cardiovascular: Negative.   Gastrointestinal: Negative.   Genitourinary: Negative.   Musculoskeletal: Negative.    Skin: Negative.   Neurological: Negative.   Psychiatric/Behavioral: Negative.     There were no vitals taken for this visit. Physical Exam  Constitutional: She is oriented to person, place, and time. She appears well-developed and well-nourished.  HENT:  Head: Normocephalic and atraumatic.  Eyes: Conjunctivae are normal. Pupils are equal, round, and reactive to light.  Neck: Normal range of motion.  Cardiovascular: Normal rate.   Respiratory: Effort normal.  GI: Soft.  Musculoskeletal: Normal range of motion.  Neurological: She is alert and oriented to person, place, and time.  Skin: Skin is warm.  Psychiatric: She has a normal mood and affect. Her behavior is normal. Judgment and thought content normal.     Assessment/Plan Once all reconstruction options were presented, a focused discussion was had regarding the patient's suitability for each of these procedures.  She is a good candidate for a right immediate breast reconstruction with an expander and FlexHD. She is in agreement.   Wallace Going 03/06/2015, 10:58 AM

## 2015-03-13 NOTE — Anesthesia Procedure Notes (Addendum)
Anesthesia Regional Block:  Pectoralis block  Pre-Anesthetic Checklist: ,, timeout performed, Correct Patient, Correct Site, Correct Laterality, Correct Procedure, Correct Position, site marked, Risks and benefits discussed,  Surgical consent,  Pre-op evaluation,  At surgeon's request and post-op pain management  Laterality: Right  Prep: chloraprep       Needles:  Injection technique: Single-shot  Needle Type: Echogenic Stimulator Needle     Needle Length: 10cm 10 cm Needle Gauge: 21 and 21 G    Additional Needles:  Procedures: ultrasound guided (picture in chart) Pectoralis block Narrative:  Start time: 03/13/2015 6:51 AM End time: 03/13/2015 7:01 AM Injection made incrementally with aspirations every 5 mL.  Performed by: Personally    Procedure Name: LMA Insertion Date/Time: 03/13/2015 7:29 AM Performed by: Barrington Ellison Pre-anesthesia Checklist: Patient identified, Emergency Drugs available, Suction available, Patient being monitored and Timeout performed Patient Re-evaluated:Patient Re-evaluated prior to inductionOxygen Delivery Method: Circle system utilized Preoxygenation: Pre-oxygenation with 100% oxygen Intubation Type: IV induction Ventilation: Mask ventilation without difficulty LMA: LMA inserted LMA Size: 4.0 Number of attempts: 1 Placement Confirmation: positive ETCO2 and breath sounds checked- equal and bilateral Tube secured with: Tape Dental Injury: Teeth and Oropharynx as per pre-operative assessment

## 2015-03-13 NOTE — Transfer of Care (Signed)
Immediate Anesthesia Transfer of Care Note  Patient: Carolyn Berg  Procedure(s) Performed: Procedure(s): RIGHT MASTECTOMY WITH AXILLARY SENTINEL LYMPH NODE BIOPSY (Right) IMMEDIATE RIGHT BREAST RECONSTRUCTION WITH PLACEMENT OF TISSUE EXPANDER AND FLEX PLIABLE 4X16  (Right)  Patient Location: PACU  Anesthesia Type:General  Level of Consciousness: awake and oriented  Airway & Oxygen Therapy: Patient Spontanous Breathing and Patient connected to nasal cannula oxygen  Post-op Assessment: Report given to RN  Post vital signs: Reviewed and stable  Last Vitals:  Filed Vitals:   03/13/15 0551  BP: 114/67  Pulse: 95  Temp: 36.7 C  Resp: 18    Complications: No apparent anesthesia complications

## 2015-03-13 NOTE — Interval H&P Note (Signed)
History and Physical Interval Note:  03/13/2015 7:16 AM  Carolyn Berg  has presented today for surgery, with the diagnosis of right breast cancer  The various methods of treatment have been discussed with the patient and family. After consideration of risks, benefits and other options for treatment, the patient has consented to  Procedure(s): RIGHT MASTECTOMY WITH AXILLARY SENTINEL LYMPH NODE BIOPSY (Right) IMMEDIATE RIGHT BREAST RECONSTRUCTION WITH PLACEMENT OF TISSUE EXPANDER AND FLEX PLIABLE 4X16  (Right) as a surgical intervention .  The patient's history has been reviewed, patient examined, no change in status, stable for surgery.  I have reviewed the patient's chart and labs.  Questions were answered to the patient's satisfaction.     Wallace Going

## 2015-03-13 NOTE — Brief Op Note (Signed)
03/13/2015  10:27 AM  PATIENT:  Carolyn Berg  49 y.o. female  PRE-OPERATIVE DIAGNOSIS:  right breast cancer  POST-OPERATIVE DIAGNOSIS:  right breast cancer  PROCEDURE:  Procedure(s): RIGHT MASTECTOMY WITH AXILLARY SENTINEL LYMPH NODE BIOPSY (Right) IMMEDIATE RIGHT BREAST RECONSTRUCTION WITH PLACEMENT OF TISSUE EXPANDER AND FLEX PLIABLE 4X16  (Right)  SURGEON:  Surgeon(s) and Role: Panel 1:    * Alphonsa Overall, MD - Primary  Panel 2:    * Loel Lofty Dillingham, DO - Primary  PHYSICIAN ASSISTANT:   ASSISTANTS: none   ANESTHESIA:   general  EBL:  Total I/O In: 1000 [I.V.:1000] Out: 100 [Blood:100]  BLOOD ADMINISTERED:none  DRAINS: (1) Jackson-Pratt drain(s) with closed bulb suction in the right breast pocket   LOCAL MEDICATIONS USED:  NONE  SPECIMEN:  Breast flap tissue  DISPOSITION OF SPECIMEN:  PATHOLOGY  COUNTS:  YES  TOURNIQUET:  * No tourniquets in log *  DICTATION: .Dragon Dictation  PLAN OF CARE: Admit for overnight observation  PATIENT DISPOSITION:  PACU - hemodynamically stable.   Delay start of Pharmacological VTE agent (>24hrs) due to surgical blood loss or risk of bleeding: no

## 2015-03-13 NOTE — Interval H&P Note (Signed)
History and Physical Interval Note:  03/13/2015 7:12 AM  Carolyn Berg  has presented today for surgery, with the diagnosis of right breast cancer  The various methods of treatment have been discussed with the patient and family.   Husband at bedside.  She was not happy about finding out about the block in the holding area.  After consideration of risks, benefits and other options for treatment, the patient has consented to  Procedure(s): RIGHT MASTECTOMY WITH AXILLARY SENTINEL LYMPH NODE BIOPSY (Right) IMMEDIATE RIGHT BREAST RECONSTRUCTION WITH PLACEMENT OF TISSUE EXPANDER AND FLEX PLIABLE 4X16  (Right) as a surgical intervention .  The patient's history has been reviewed, patient examined, no change in status, stable for surgery.  I have reviewed the patient's chart and labs.  Questions were answered to the patient's satisfaction.     Valecia Beske H

## 2015-03-13 NOTE — Op Note (Signed)
03/13/2015  10:51 AM  PATIENT:  Carolyn Berg, 49 y.o., female, MRN: [id number]  PREOP DIAGNOSIS:  right breast cancer  POSTOP DIAGNOSIS:   right breast cancer, 7 o'clock position (Tis, N0)  PROCEDURE:   Procedure(s): RIGHT MASTECTOMY WITH AXILLARY SENTINEL LYMPH NODE BIOPSY - Lucia Gaskins,  IMMEDIATE RIGHT BREAST RECONSTRUCTION WITH PLACEMENT OF TISSUE EXPANDER AND FLEX PLIABLE 4X16 - Dillingham  SURGEON:   Alphonsa Overall, M.D.  ASSISTANT:   None  ANESTHESIA:   general  Anesthesiologist: Roberts Gaudy, MD; Duane Boston, MD CRNA: Suzy Bouchard, CRNA; Barrington Ellison, CRNA  General  ASA:  2  EBL:  200  ml  BLOOD ADMINISTERED: none  DRAINS: to be determined by Dr. Marla Roe  LOCAL MEDICATIONS USED:   NOne  SPECIMEN:   Right breast (long suture lateral, short suture in inferior nipple), sentinel lymph node (counts - 1,600, background - 80)  COUNTS CORRECT:  YES  INDICATIONS FOR PROCEDURE:  Carolyn Berg is a 49 y.o. (DOB: Aug 19, 1965) white  female whose primary care physician is Pcp Not In System and comes for right breast mastectomy with immediate reconstruction.   Ms. Vanderslice had microca++ seen at 7 o'clock in the right breast on mammograms from [facility] on 01/31/2015.  Biopsy showed DCIS.  Her MRI on 02/10/2015 showed a 5.2 x 5.0 cm area of non-mass enhancement in the lower outer quadrant.  After discussion of the options for treating the breast, we are proceeding with a right mastectomy with immediate reconstruction by Dr. Marla Roe.   Her oncologist are Drs. Burr Medico and [place].    The indications and risks of the surgery were explained to the patient.  The risks include, but are not limited to, infection, bleeding, and nerve injury.   Her right breast was injected in the pre op area with 1 millicurie of technitium sulfa colloid.  OPERATIVE NOTE;  The patient was taken to room # 1 at [facility] where she underwent a general anesthesia  supervised by Anesthesiologist: Roberts Gaudy, MD; Duane Boston, MD CRNA: Suzy Bouchard, CRNA; Barrington Ellison, CRNA. Her right breast and axilla were prepped with  ChloraPrep and sterilely draped.    A time-out and the surgical check list was reviewed.    I did not inject methylene blue.   I made an elliptical incision including the areola in the right breast.  I developed skin flaps medially to the lateral edge of the sternum, inferiorly to the investing fascia of the rectus abdominus muscle, laterally to the anterior edge of the latissimus dorsi muscle, and superiorly to about 2 finger breaths below the clavicle.  The breast was reflected off the pectoralis muscle from medial to lateral.  The lateral attachments in the right axilla were divided and the breast removed.  A long suture was placed on the lateral aspect of the breast and a short suture at the inferior nipple.   Note her biopsy site is at the 7 o'clock and there is a nodule at that area.  I took all tissue between the skin and the pectoralis muscle.   Because of high the right axillary sentinel lymph was, I made a separate incision to find the node.  I dissected into the right axilla and found a sentinel lymph node.  The node had counts of 1,600 with a background count of 80.    This was sent as a separate specimen.   At this point, I was finished with right breast dissection/mastectomy and the  axillary lymph node biopsy, Dr. Marla Roe scrubbed in to do the breast reconstruction.  Alphonsa Overall, MD, [facility] Pager: [phone] Office phone:  [phone]

## 2015-03-14 ENCOUNTER — Encounter (HOSPITAL_COMMUNITY): Payer: Self-pay | Admitting: Surgery

## 2015-03-14 DIAGNOSIS — D0511 Intraductal carcinoma in situ of right breast: Secondary | ICD-10-CM | POA: Diagnosis not present

## 2015-03-14 NOTE — Discharge Instructions (Signed)
Drain Care Continue binder No heavy lifting

## 2015-03-14 NOTE — Progress Notes (Signed)
Patient discharged to home with instructions. 

## 2015-03-20 ENCOUNTER — Telehealth: Payer: Self-pay | Admitting: *Deleted

## 2015-03-20 ENCOUNTER — Other Ambulatory Visit: Payer: Self-pay | Admitting: *Deleted

## 2015-03-20 ENCOUNTER — Telehealth: Payer: Self-pay | Admitting: Hematology

## 2015-03-20 DIAGNOSIS — C50311 Malignant neoplasm of lower-inner quadrant of right female breast: Secondary | ICD-10-CM

## 2015-03-20 NOTE — Telephone Encounter (Signed)
Survivorship appointment made and patient will see dr Burr Medico 03/27/15 and talk about survivorship

## 2015-03-20 NOTE — Telephone Encounter (Signed)
I don't think she needs radiation but I am happy to go over everything with her in person just to ease her mind.  Please have Santiago Glad make a follow up appointment with me.

## 2015-03-20 NOTE — Telephone Encounter (Signed)
Santiago Glad will call and set up the follow up appt, Thayer Headings

## 2015-03-20 NOTE — Telephone Encounter (Signed)
Returned call to Ms Mettler, she has had her surgery,spoke with Dr. Lucia Gaskins and she stated he had spoke with Dr. Pablo Ledger and that she didn't need radiation, she just wanted to make sure that was Dr. Betsey Holiday p[osition, informed her Dr. Enos Fling is out of the office today and tomorrow ,but I would in basket her and get back with the patient either today or  The latest on  Monday, Patient satisfied with that,thanked this RN for returning her call so soon today 10:58 AM

## 2015-03-22 DIAGNOSIS — Z9011 Acquired absence of right breast and nipple: Secondary | ICD-10-CM | POA: Insufficient documentation

## 2015-03-24 ENCOUNTER — Ambulatory Visit: Payer: BLUE CROSS/BLUE SHIELD

## 2015-03-26 NOTE — Progress Notes (Signed)
Location of Breast Cancer: Right Breast  Histology per Pathology Report:   03/13/2015 1. Lymph node, sentinel, biopsy, right axillary - ONE BENIGN LYMPH NODE WITH NO TUMOR SEEN (0/1). 2. Breast, simple mastectomy, right - INVASIVE GRADE II DUCTAL CARCINOMA, SPANNING 0.4 CM IN GREATEST DIMENSION. - EXTENSIVE INTERMEDIATE GRADE DUCTAL CARCINOMA IN SITU WITH COMEDONECROSIS AND CALCIFICATIONS. - DUCTAL CARCINOMA IN SITU FOCALLY INVOLVES THE ANTERIOR MARGIN AND BROADLY IS LESS THAN 0.1 CM AWAY FROM THE ANTERIOR MARGIN. - DUCTAL CARCINOMA IN SITU IS BROADLY LESS THAN 0.1 CM AWAY FROM THE POSTERIOR MARGIN AND THE MARGIN CANNOT BE CLEARED OF FOCAL INVOLVEMENT WITH DUCTAL CARCINOMA IN SITU. - OTHER MARGINS ARE NEGATIVE. - SEE ONCOLOGY TEMPLATE AND COMMENT. 3. Skin , right breast skin flaps - BENIGN SKIN WITH UNDERLYING BENIGN BREAST TISSUE. - NO DYSPLASIA, ATYPIA OR TUMOR SEEN.   Receptor Status: ER(90%), PR (90%), Her2-neu (NEG)  Did patient present with symptoms or was this found on screening mammography?: It was found on her yearly screening mammogram.  Past/Anticipated interventions by surgeon, if any: RIGHT MASTECTOMY WITH AXILLARY SENTINEL LYMPH NODE BIOPSY - [place],  IMMEDIATE RIGHT BREAST RECONSTRUCTION WITH PLACEMENT OF TISSUE EXPANDER AND FLEX PLIABLE 4X16 - Dillingham  Past/Anticipated interventions by medical oncology, if any: She saw Dr. Burr Medico on 02/11/15 and has an appointment with her again today at 1:00.  Lymphedema issues, if any: None  Pain issues, if any: No pain  SAFETY ISSUES:  Prior radiation? NO  Pacemaker/ICD? No  Possible current pregnancy? NO  Is the patient on methotrexate? No  Current Complaints / other details:   She has expanders in and reports tightness in this area.   She denies any pain at this time.      Malmfelt, Stephani Police, RN 03/26/2015,3:21 PM

## 2015-03-27 ENCOUNTER — Ambulatory Visit
Admission: RE | Admit: 2015-03-27 | Discharge: 2015-03-27 | Disposition: A | Discharge status: Home / Self Care | Payer: BLUE CROSS/BLUE SHIELD | Source: Ambulatory Visit | Attending: Radiation Oncology | Admitting: Radiation Oncology

## 2015-03-27 ENCOUNTER — Telehealth: Payer: Self-pay | Admitting: *Deleted

## 2015-03-27 ENCOUNTER — Ambulatory Visit (HOSPITAL_BASED_OUTPATIENT_CLINIC_OR_DEPARTMENT_OTHER): Payer: BLUE CROSS/BLUE SHIELD | Admitting: Hematology

## 2015-03-27 ENCOUNTER — Telehealth: Payer: Self-pay | Admitting: Hematology

## 2015-03-27 ENCOUNTER — Encounter: Payer: Self-pay | Admitting: Hematology

## 2015-03-27 ENCOUNTER — Encounter: Payer: Self-pay | Admitting: Radiation Oncology

## 2015-03-27 VITALS — BP 100/56 | HR 98 | Temp 97.8°F | Ht 64.0 in | Wt 120.8 lb

## 2015-03-27 VITALS — BP 112/55 | HR 90 | Temp 97.7°F | Resp 20 | Ht 64.0 in | Wt 122.4 lb

## 2015-03-27 DIAGNOSIS — C50311 Malignant neoplasm of lower-inner quadrant of right female breast: Secondary | ICD-10-CM | POA: Insufficient documentation

## 2015-03-27 DIAGNOSIS — Z9011 Acquired absence of right breast and nipple: Secondary | ICD-10-CM | POA: Diagnosis not present

## 2015-03-27 DIAGNOSIS — Z170 Estrogen receptor positive status [ER+]: Secondary | ICD-10-CM

## 2015-03-27 MED ORDER — TAMOXIFEN CITRATE 20 MG PO TABS: 20.0000 mg | ORAL_TABLET | Freq: Every day | ORAL | Status: DC

## 2015-03-27 NOTE — Progress Notes (Signed)
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 She underwent a right mastectomy and sentinel lymph node biopsy on 03/13/2015. This showed a very small focus of invasive Grade II ductal carcinoma. Also, included in the mastectomy specimen was extensive intermediate grade ductal carcinoma DCIS with comedonecrosis. This DCIS focally involved the anterior soft tissue margin and possibly a posterior margin. Both anterior and posterior margins were broadly close at less than 63mm. One sentinel lymph node was removed and was negative. She has recovered well from her mastectomy. Her case was presented at multidisciplinary conference yesterday (03/26/2015). She is scheduled to see Dr. Burr Medico, MD today (03/27/2015) at 1:00pm to discuss the medication Tamoxifen.   Physical Exam: The patient is alert and oriented x3. There is no significant changes to the status of overall health to be noted at this time. Filed Vitals:   03/27/15 1043  BP: 100/56  Pulse: 98  Temp: 97.8 F (36.6 C)  Height: 5\' 4"  (1.626 m)  Weight: 120 lb 12.8 oz (54.795 kg)    IMPRESSION: Carolyn Berg is a 49 y.o. female presenting to clinic in regards to her T1aN0 invasive ductal carcinoma of the right female  breast, status post mastectomy with focally positive and broadly close margins with DCIS after mastectomy. She is managing reported symptoms appropriately. She understands the definition, significance, and presense of DCIS, specifically in regards to her cancer and how this may influence her treatment. The patient understands why surgery and radiation therapy treatments are not recommended at this time. She did not vocalize interest in pursuing surgery or radiation therapy treatments, moving forward. The patient is aware that she may access her appointments and medical records via [facility].   PLAN:  I have reviewed the literature on radiation in the post mastectomy setting on patient with focally positive and broadly close margins with DCIS. I am an Chief Strategy Officer on one of these publications. The most recent review includes 1500 patients with 10 year follow-up and shows a reoccurrence rate of 1.4-3% in patients with close or positive margins who do not receive adjuvant therapy. There does not seem to be a difference based on age or comedo necrosis. Higher numbers have been reported up to 10-15% in small series with short follow-up. After discussion it is my recommendation, as well as the multidiscplinary tumor board, that she proceed on with anti-estrogen therapy. There is no role for radiation. I discussed this with her. She will follow-up with Dr. Burr Medico, MD today (03/27/2015) at 1:00pm. I will see her back on an as needed basis. She is to attend her appointment with Dr. Lucia Gaskins, MD as scheduled. All vocalized questions and concerns have been addressed. If the patient develops any further questions or concerns in  regards to her treatment and recovery, she has been encouraged to contact Dr. Pablo Ledger, MD or Dr. Burr Medico, MD.  This document serves as a record of services personally performed by Thea Silversmith , MD. It was created on her behalf by Lenn Cal, a trained medical scribe. The creation of this record is  based on the scribe's personal observations and the provider's statements to them. This document has been checked and approved by the attending provider.   ------------------------------------------------  Thea Silversmith, MD

## 2015-03-27 NOTE — Telephone Encounter (Signed)
Called pt to assess needs after xrt. Relate doing well and without complaints. Discussed survivorship program and confirmed future appts. Encourage pt to call with questions or concerns. Received verbal understanding.

## 2015-03-27 NOTE — Progress Notes (Signed)
[place]  [phone] [phone]  Clinic follow up Note   Patient Care Team: Earlie Raveling, NP as PCP - General (Nurse Practitioner) Alphonsa Overall, MD as Consulting Physician (General Surgery) Truitt Merle, MD as Consulting Physician (Hematology) Thea Silversmith, MD as Consulting Physician (Radiation Oncology) Mauro Kaufmann, RN as Registered Nurse Rockwell Germany, RN as Registered Nurse Sylvan Cheese, NP as Nurse Practitioner (Nurse Practitioner) Wallace Going, DO as Consulting Physician (Plastic Surgery) 03/27/2015  CHIEF COMPLAINTS: Follow up right breast cancer   Oncology History   Breast cancer of lower-inner quadrant of right female breast [facility])   Staging form: Breast, AJCC 7th Edition     Clinical stage from 02/12/2015: Stage 0 (Tis (DCIS), N0, M0) - Unsigned       Staging comments: Staged at breast conference on 9.21.16      Pathologic stage from 03/13/2015: Stage IA (T1a, N0, cM0) - Signed by Truitt Merle, MD on 03/26/2015        Breast cancer of lower-inner quadrant of right female breast ([place])   01/28/2015 Mammogram Screening mammogram showed new regional heterogeneous calcifications in the right breast central to the nipple midline depth.    02/06/2015 Initial Biopsy Right breast core needle biopsy showed DCIS with calcifications and necrosis.   02/07/2015 Initial Diagnosis Breast cancer of lower-inner quadrant of right female breast   02/10/2015 Imaging Breast MRI showed non-mass enhancement involves majority of the lower, outer quadrant of the right breast spanning at least 3.4 x 5.0 x 5.2 cm. No other lesion or adenopathy.   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 margins were negative for invasive carcinoma, but DCIS was involved in anterior margin, and broadly <0.1cm on other margins. Dr. Lucia Gaskins does not  think there is more to cut.      HISTORY OF PRESENTING ILLNESS:  Carolyn Berg 49 y.o. female is here because of newly diagnosed right breast cancer. She presents to our multidisciplinary breast clinic to discuss further management. She is accompanied by her husband.  This was discovered by screening mammogram. She is very compliant with year screening mammogram. Her screening mammogram on 01/28/2015 showed a new regional heterogeneous calcification in the lower-outer quadrant of right breast. She underwent core needle biopsy on 02/07/2015, which showed DCIS.  She feels well overall, denies any symptoms, no pain, dyspnea, weight loss or night sweats. She is a housewife, very physically active, no significant past medical history, no family history of breast cancer. She is premenopausal, takes birth control pill.  INTERIM HISTORY Ms. Buhrman returns for follow-up. She underwent right mastectomy and sentinel lymph node biopsy on 03/13/2015. She tolerated the surgery well, still has a drainage tube in place which is draining minimally. She is going to see Dr. Lucia Gaskins tomorrow. The pain at the incision site has much improved, she is off pain medication now. No right arm swelling, but her shoulder range of motion is still limited. She otherwise feels well, no other new complaints.  MEDICAL HISTORY:  Past Medical History  Diagnosis Date  . Fibroids   . Anxiety     anticipation of mastectomy   . Breast cancer of lower-inner quadrant of right female breast ([place]) 02/07/2015  . Basal cell carcinoma of right thigh 05/2013    SURGICAL HISTORY: Past Surgical History  Procedure Laterality Date  . Finger fracture surgery Left     "  middle finger"  . Skin biopsy Right 05/2013    basal cell on thigh  . Vaginal delivery      /w epidural   . Wisdom tooth extraction  early 2000's    /w IV sedation   . Breast reconstruction with placement of tissue expander and flex hd (acellular hydrated dermis) Right  03/13/2015  . Mastectomy complete / simple w/ sentinel node biopsy Right 03/13/2015  . Breast surgery Right 01/2015  . Partial mastectomy with axillary sentinel lymph node biopsy Right 03/13/2015    Procedure: RIGHT MASTECTOMY WITH AXILLARY SENTINEL LYMPH NODE BIOPSY;  Surgeon: Alphonsa Overall, MD;  Location: [facility];  Service: General;  Laterality: Right;  . Breast reconstruction with placement of tissue expander and flex hd (acellular hydrated dermis) Right 03/13/2015    Procedure: IMMEDIATE RIGHT BREAST RECONSTRUCTION WITH PLACEMENT OF TISSUE EXPANDER AND FLEX PLIABLE 4X16 ;  Surgeon: Loel Lofty Dillingham, DO;  Location: [facility];  Service: Plastics;  Laterality: Right;   GYN HISTORY  Menarchal:13 LMP: 03/27/2015 Contraceptive: 20+ years  HRT: N/A  G1P1: daughter is 58, no breast feeding, no plan for more children    SOCIAL HISTORY: Social History   Social History  . Marital Status: Married    Spouse Name: N/A  . Number of Children: 1 daughter 68 yo   . Years of Education: N/A   Occupational History  . Not on file.   Social History Main Topics  . Smoking status: Never Smoker   . Smokeless tobacco: Never Used  . Alcohol Use: Yes     Comment: occ glass of wine  . Drug Use: No  . Sexual Activity:    Partners: Male    Birth Control/ Protection: Pill   Other Topics Concern  . Not on file   Social History Narrative    FAMILY HISTORY: Family History  Problem Relation Age of Onset  . Diabetes Paternal Grandfather   . Heart attack Paternal Grandfather   . Colon cancer Maternal Grandfather 47    lived to 33  . Renal Disease Maternal Grandfather   . Breast cancer Other     MGF's mother dx over 68  . Lung cancer Maternal Uncle   . Stroke Maternal Grandmother     ALLERGIES:  has No Known Allergies.  MEDICATIONS:  Current Outpatient Prescriptions  Medication Sig Dispense Refill  . diazepam (VALIUM) 2 MG tablet Take 2 mg by mouth every evening. At bedtime    . ibuprofen  (ADVIL,MOTRIN) 200 MG tablet Take 200 mg by mouth every 6 (six) hours as needed (2 tabs po prn pain).    . Influenza vac split quadrivalent PF (FLUARIX QUADRIVALENT) 0.5 ML injection FLUSHOT    . ondansetron (ZOFRAN) 4 MG tablet TAKE 1 TABLET BY MOUTH EVERY 8 HOURS AS NEEDED FOR NAUSEA AND VOMITING FOR UP TO 7 DAYS    . HYDROcodone-acetaminophen (NORCO) 5-325 MG tablet Take 1 tablet by mouth.    . tamoxifen (NOLVADEX) 20 MG tablet Take 1 tablet (20 mg total) by mouth daily. 30 tablet 3   No current facility-administered medications for this visit.    REVIEW OF SYSTEMS:   Constitutional: Denies fevers, chills or abnormal night sweats Eyes: Denies blurriness of vision, double vision or watery eyes Ears, nose, mouth, throat, and face: Denies mucositis or sore throat Respiratory: Denies cough, dyspnea or wheezes Cardiovascular: Denies palpitation, chest discomfort or lower extremity swelling Gastrointestinal:  Denies nausea, heartburn or change in bowel habits Skin: Denies abnormal skin rashes  Lymphatics: Denies new lymphadenopathy or easy bruising Neurological:Denies numbness, tingling or new weaknesses Behavioral/Psych: Mood is stable, no new changes  All other systems were reviewed with the patient and are negative.  PHYSICAL EXAMINATION: ECOG PERFORMANCE STATUS: 1  Filed Vitals:   03/27/15 1310  BP: 112/55  Pulse: 90  Temp: 97.7 F (36.5 C)  Resp: 20   Filed Weights   03/27/15 1310  Weight: 122 lb 6.4 oz (55.52 kg)    GENERAL:alert, no distress and comfortable SKIN: skin color, texture, turgor are normal, no rashes or significant lesions EYES: normal, conjunctiva are pink and non-injected, sclera clear OROPHARYNX:no exudate, no erythema and lips, buccal mucosa, and tongue normal  NECK: supple, thyroid normal size, non-tender, without nodularity LYMPH:  no palpable lymphadenopathy in the cervical, axillary or inguinal LUNGS: clear to auscultation and percussion with normal  breathing effort HEART: regular rate & rhythm and no murmurs and no lower extremity edema ABDOMEN:abdomen soft, non-tender and normal bowel sounds Musculoskeletal:no cyanosis of digits and no clubbing  PSYCH: alert & oriented x 3 with fluent speech NEURO: no focal motor/sensory deficits Breasts: s/p right ostectomy, incision site is healing well, clean, no discharge, no skin erythema. A draining tube is in place. Palpation of left breast and axilla revealed no obvious mass that I could appreciate.   LABORATORY DATA:  I have reviewed the data as listed Lab Results  Component Value Date   WBC 11.4* 03/13/2015   HGB 11.0* 03/13/2015   HCT 33.1* 03/13/2015   MCV 92.5 03/13/2015   PLT 292 03/13/2015    Recent Labs  02/12/15 0848 03/11/15 1351 03/13/15 1635  NA 141 136 134*  K 4.2 4.0 4.5  CL  --  102 102  CO2 25 27 24   GLUCOSE 95 102* 164*  BUN 10.9 10 7   CREATININE 0.9 0.94 0.97  CALCIUM 9.1 9.0 8.6*  GFRNONAA  --  >60 >60  GFRAA  --  >60 >60  PROT 7.1  --   --   ALBUMIN 3.8  --   --   AST 16  --   --   ALT 11  --   --   ALKPHOS 50  --   --   BILITOT 0.56  --   --     PATHOLOGY REPORT  Diagnosis 03/13/2015 1. Lymph node, sentinel, biopsy, right axillary - ONE BENIGN LYMPH NODE WITH NO TUMOR SEEN (0/1). 2. Breast, simple mastectomy, right - INVASIVE GRADE II DUCTAL CARCINOMA, SPANNING 0.4 CM IN GREATEST DIMENSION. - EXTENSIVE INTERMEDIATE GRADE DUCTAL CARCINOMA IN SITU WITH COMEDONECROSIS AND CALCIFICATIONS. - DUCTAL CARCINOMA IN SITU FOCALLY INVOLVES THE ANTERIOR MARGIN AND BROADLY IS LESS THAN 0.1 CM AWAY FROM THE ANTERIOR MARGIN. - DUCTAL CARCINOMA IN SITU IS BROADLY LESS THAN 0.1 CM AWAY FROM THE POSTERIOR MARGIN AND THE MARGIN CANNOT BE CLEARED OF FOCAL INVOLVEMENT WITH DUCTAL CARCINOMA IN SITU. - OTHER MARGINS ARE NEGATIVE. - SEE ONCOLOGY TEMPLATE AND COMMENT. 3. Skin , right breast skin flaps - BENIGN SKIN WITH UNDERLYING BENIGN BREAST TISSUE. - NO  DYSPLASIA, ATYPIA OR TUMOR SEEN.  Microscopic Comment 2. BREAST, INVASIVE TUMOR, WITH LYMPH NODES PRESENT Specimen, including laterality and lymph node sampling (sentinel, non-sentinel): Right breast with right sentinel lymph node and skin flaps. Procedure: Right mastectomy with axillary sentinel lymph node biopsy. Histologic type: Invasive ductal carcinoma. Grade: 2. Tubule formation: 3. Nuclear pleomorphism: 2. Mitotic: 1. Tumor size (glass slide measurement): 0.4 cm. Margins: Invasive, distance to closest margin: At least 0.3  cm (posterior margin). In-situ, distance to closest margin: Ductal carcinoma in situ focally involves the anterior soft tissue margin; there is a focal area of the posterior margin which cannot be effectively cleared of involvement, see below comment. If margin positive, focally or broadly: Focally positive. Lymphovascular invasion: Not identified. Ductal carcinoma in situ: Yes. Grade: Intermediate grade. Extensive intraductal component: Yes. Lobular neoplasia: Not identified. Tumor focality: Invasive tumor is unifocal. Treatment effect: Not applicable. Extent of tumor: Tumor confined to breast parenchyma. Skin: Not involved. Nipple: Not involved. Skeletal muscle: Not involved. Lymph nodes: Examined: 1 Sentinel. 0 Non-sentinel. 1 Total. Lymph nodes with metastasis: 0. Isolated tumor cells (< 0.2 mm): 0. Micrometastasis: (> 0.2 mm and < 2.0 mm): 0. Macrometastasis: (> 2.0 mm): 0. Extracapsular extension: Not applicable. Breast prognostic profile: Will be performed on current invasive tumor. Estrogen receptor: Will be performed on current invasive tumor. Progesterone receptor: Will be performed on current invasive tumor. Her 2 neu: Will be performed on current invasive tumor. Ki-67: Will be performed on current invasive tumor. Additional breast findings: Fibrocystic changes with usual ductal hyperplasia. TNM: pT1a, pN0. Comments: There is a focal  area of atypical cauterized glands where involvement with ductal carcinoma in situ of the posterior margin cannot be excluded (the margin cannot be effectively cleared). A breast prognostic profile will be performed on the invasive ductal carcinoma. This will be reported in an addendum to follow. (RH:ds 03/14/15)  Results: IMMUNOHISTOCHEMICAL AND MORPHOMETRIC ANALYSIS PERFORMED MANUALLY Estrogen Receptor: 90%, POSITIVE, STRONG STAINING INTENSITY Progesterone Receptor: 90%, POSITIVE, STRONG STAINING INTENSITY Proliferation Marker Ki67: 10%  Results: HER2 - NEGATIVE RATIO OF HER2/CEP17 SIGNALS 1.28 AVERAGE HER2 COPY NUMBER PER CELL 2.95  RADIOGRAPHIC STUDIES: I have personally reviewed the radiological images as listed and agreed with the findings in the report.  Mr Breast Bilateral W Wo Contrast 02/10/2015    FINDINGS: Breast composition: d. Extreme fibroglandular tissue.  Background parenchymal enhancement: Marked  Right breast: There is asymmetric non mass enhancement involving the majority of the lower, outer quadrant of the right breast when compared to the left. This non mass enhancement is difficult to precisely measure given the marked background enhancement. However, the non mass enhancement appears to span at least 3.4 (AP) x 5.0 (TR) x 5.2 (CC) cm. Biopsy marker artifact corresponding to the known site of DCIS is seen within the inferolateral aspect of this enhancement in the lower, outer right breast, series 5, image 115.  Left breast: No dominant mass or suspicious enhancement. There is marked background parenchymal enhancement.  Lymph nodes: No abnormal appearing lymph nodes.  Ancillary findings:  None.   IMPRESSION: Asymmetric, non mass enhancement involves the majority of the lower, outer quadrant of the right breast spanning at least 3.4 x 5.0 x 5.2 cm. This non mass enhancement appears to correspond to the extent of calcifications seen mammographically. If breast conservation is  being considered, additional stereotactic biopsy of the right breast may be warranted for further evaluation of extent of disease.  RECOMMENDATION: Treatment planning.  BI-RADS CATEGORY  6: Known biopsy-proven malignancy.   Electronically Signed   By: Pamelia Hoit M.D.   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  1. right breast ductal carcinoma, pT1aN0M0, stage IA, G2, ER and PR strongly positive, HER-2 negative, (+) DCIS,  -I reviewed her surgical pathology findings with her and her friend in great details. Her initial biopsy only showed a DCIS, and final surgical  pathology reviewed small size of invasive ductal carcinoma.  -Her invasive tumor is very small, and the surgical margins were negative. Giving the small size, low Ki-67, negative node, I do not recommend adjuvant chemotherapy. -We discussed that her DCIS margins were positive or very close, however there is no more breast tissue left for reexcision. -Her case was discussed in our multidisciplinary tumor Board, radiation was felt to be not beneficial, and was not offered. She had detailed discussion with Dr. Pablo Ledger today. -Given her strong ER/PR positivity, I strongly recommend her consider adjuvant endocrine therapy. She is premenopausal, I recommend tamoxifen for total of 10 years, or switch tamoxifen to aromatase inhibitor for additional 5 years when she becomes postmenopausal sown the road. -Potential side effects of tamoxifen, which includes but not limited to, hot flash, skin and vaginal dryness, slightly increased risk of thrombosis and endometrial cancer, and small risk of cardiac vascular disease, cataract, we'll discuss with her in great details, written material was given to her. She is a young, physically very active, no other comorbidities, would be a good candidate for tamoxifen. She voiced good understanding and agreed to proceed. -Sent a prescription of  Tamoxifen to her pharmacy today, I would like her to recover better from surgery, she will start tamoxifen in 2-4 weeks when she recovers completely from surgery. -We also discussed breast cancer surveillance, I encouraged her to have annual mammogram, and also consider breast MI due to her dense breast tissue if her insurance coverage.   2. Genetics -Her genetic testing was negative for inheritable breast/ovarian syndrome.  Plan: I'll see her back in 2 months for follow up with lab.   All questions were answered. The patient knows to call the clinic with any problems, questions or concerns. I spent 30 minutes counseling the patient face to face. The total time spent in the appointment was 35 minutes and more than 50% was on counseling.       Truitt Merle, MD 03/27/2015 5:49 PM

## 2015-03-27 NOTE — Telephone Encounter (Signed)
per pof to sch pt appt-gave pt copy of avs °

## 2015-03-27 NOTE — Addendum Note (Signed)
Encounter addended by: Jenene Slicker, RN on: 03/27/2015  5:12 PM<BR>     Documentation filed: Charges VN

## 2015-03-31 NOTE — Discharge Summary (Signed)
Physician Discharge Summary  Patient ID: Carolyn Berg MRN: [id number] DOB/AGE: 1966/01/20 49 y.o.  Admit date: 03/13/2015 Discharge date: 03/14/2015  Admission Diagnoses: Breast cancer  Discharge Diagnoses:  Active Problems:   * No active hospital problems. *   Discharged Condition: stable  Hospital Course: The patient is a 49 yrs old wf here for treatment of her right breast cancer.  She was taken to the OR and underwent a right mastectomy with immediate reconstruction with expander and ADM placement.  She did very well postoperatively.  She was ambulating, tolerating food, and her pain was controlled.  Her drain was in place and functional. She was discharged to home.  Consults: None  Significant Diagnostic Studies: none  Treatments: surgery:   Discharge Exam: Blood pressure 108/56, pulse 81, temperature 98.9 F (37.2 C), temperature source Oral, resp. rate 16, weight 55.339 kg (122 lb), last menstrual period 02/13/2015, SpO2 99 %. General appearance: alert, cooperative and no distress Skin: Skin color, texture, turgor normal. No rashes or lesions. Flaps viable and incision closed.  Disposition: 01-Home or Self Care     Medication List    STOP taking these medications        levonorgestrel-ethinyl estradiol 0.1-20 MG-MCG tablet  Commonly known as:  AVIANE,ALESSE,LESSINA           Follow-up Information    Follow up with Wallace Going, DO In 1 week.   Specialty:  Plastic Surgery   Contact information:   [place] [address] [phone]       Follow up with [facility], MD In 2 weeks.   Specialty:  General Surgery   Contact information:   [address] [place] [address] [phone]       Follow up with Wallace Going, DO On 03/18/2015.   Specialty:  Plastic Surgery   Contact information:   [place] [address] [phone]       Signed: Wallace Going 03/31/2015, 4:17  PM

## 2015-04-11 NOTE — Addendum Note (Signed)
Addendum  created 04/11/15 0705 by Duane Boston, MD   Modules edited: Anesthesia Blocks and Procedures, Clinical Notes   Clinical Notes:  File: [id number]

## 2015-04-16 ENCOUNTER — Encounter (HOSPITAL_COMMUNITY): Payer: Self-pay

## 2015-05-15 ENCOUNTER — Telehealth: Payer: Self-pay | Admitting: Nurse Practitioner

## 2015-05-15 NOTE — Telephone Encounter (Signed)
Called and spoke with patient regarding upcoming appointment in the survivorship clinic for review of her survivorship care plan. Due to a change in the provider schedule, we will reschedule this visit from January 5th to January 12th at 1:30 PM. I will mail Carolyn Berg a copy of her new calendar and have provided her with my contact information if she has any question. She has no questions at this time.

## 2015-05-23 ENCOUNTER — Other Ambulatory Visit: Payer: Self-pay | Admitting: *Deleted

## 2015-05-23 DIAGNOSIS — C50311 Malignant neoplasm of lower-inner quadrant of right female breast: Secondary | ICD-10-CM

## 2015-05-27 ENCOUNTER — Telehealth: Payer: Self-pay | Admitting: Hematology

## 2015-05-27 ENCOUNTER — Ambulatory Visit (HOSPITAL_BASED_OUTPATIENT_CLINIC_OR_DEPARTMENT_OTHER): Payer: BLUE CROSS/BLUE SHIELD | Admitting: Hematology

## 2015-05-27 ENCOUNTER — Other Ambulatory Visit (HOSPITAL_BASED_OUTPATIENT_CLINIC_OR_DEPARTMENT_OTHER): Payer: BLUE CROSS/BLUE SHIELD

## 2015-05-27 ENCOUNTER — Encounter: Payer: Self-pay | Admitting: Hematology

## 2015-05-27 VITALS — BP 104/65 | HR 75 | Temp 97.7°F | Resp 16 | Ht 64.0 in | Wt 118.8 lb

## 2015-05-27 DIAGNOSIS — Z79810 Long term (current) use of selective estrogen receptor modulators (SERMs): Secondary | ICD-10-CM

## 2015-05-27 DIAGNOSIS — C50311 Malignant neoplasm of lower-inner quadrant of right female breast: Secondary | ICD-10-CM

## 2015-05-27 DIAGNOSIS — Z170 Estrogen receptor positive status [ER+]: Secondary | ICD-10-CM

## 2015-05-27 LAB — CBC WITH DIFFERENTIAL/PLATELET
BASO%: 0.3 % (ref 0.0–2.0)
BASOS ABS: 0 10*3/uL (ref 0.0–0.1)
EOS ABS: 0 10*3/uL (ref 0.0–0.5)
EOS%: 0.9 % (ref 0.0–7.0)
HEMATOCRIT: 35.4 % (ref 34.8–46.6)
HEMOGLOBIN: 11.8 g/dL (ref 11.6–15.9)
LYMPH#: 1.9 10*3/uL (ref 0.9–3.3)
LYMPH%: 37.5 % (ref 14.0–49.7)
MCH: 30.3 pg (ref 25.1–34.0)
MCHC: 33.3 g/dL (ref 31.5–36.0)
MCV: 91.1 fL (ref 79.5–101.0)
MONO#: 0.4 10*3/uL (ref 0.1–0.9)
MONO%: 7.6 % (ref 0.0–14.0)
NEUT#: 2.7 10*3/uL (ref 1.5–6.5)
NEUT%: 53.7 % (ref 38.4–76.8)
PLATELETS: 291 10*3/uL (ref 145–400)
RBC: 3.88 10*6/uL (ref 3.70–5.45)
RDW: 13 % (ref 11.2–14.5)
WBC: 5 10*3/uL (ref 3.9–10.3)

## 2015-05-27 LAB — COMPREHENSIVE METABOLIC PANEL
ALBUMIN: 3.8 g/dL (ref 3.5–5.0)
ALK PHOS: 50 U/L (ref 40–150)
ALT: 13 U/L (ref 0–55)
ANION GAP: 7 meq/L (ref 3–11)
AST: 19 U/L (ref 5–34)
BUN: 16.5 mg/dL (ref 7.0–26.0)
CALCIUM: 9 mg/dL (ref 8.4–10.4)
CO2: 28 mEq/L (ref 22–29)
Chloride: 106 mEq/L (ref 98–109)
Creatinine: 0.9 mg/dL (ref 0.6–1.1)
EGFR: 76 mL/min/{1.73_m2} — AB (ref >90)
Glucose: 93 mg/dl (ref 70–140)
POTASSIUM: 4.2 meq/L (ref 3.5–5.1)
Sodium: 142 mEq/L (ref 136–145)
Total Bilirubin: 0.39 mg/dL (ref 0.20–1.20)
Total Protein: 7.1 g/dL (ref 6.4–8.3)

## 2015-05-27 NOTE — Telephone Encounter (Signed)
per pof to sch pt appt-gave pt copy of avs °

## 2015-05-27 NOTE — Progress Notes (Signed)
[facility]  [phone] [phone]  Clinic follow up Note   Patient Care Team: Alphonsa Overall, MD as Consulting Physician (General Surgery) Truitt Merle, MD as Consulting Physician (Hematology) Thea Silversmith, MD as Consulting Physician (Radiation Oncology) Mauro Kaufmann, RN as Registered Nurse Rockwell Germany, RN as Registered Nurse Sylvan Cheese, NP as Nurse Practitioner (Nurse Practitioner) Wallace Going, DO as Consulting Physician (Plastic Surgery) Megan Salon, MD as Obstetrician (Gynecology) 05/27/2015  CHIEF COMPLAINTS: Follow up right breast cancer   Oncology History   Breast cancer of lower-inner quadrant of right female breast [facility])   Staging form: Breast, AJCC 7th Edition     Clinical stage from 02/12/2015: Stage 0 (Tis (DCIS), N0, M0) - Unsigned       Staging comments: Staged at breast conference on 9.21.16      Pathologic stage from 03/13/2015: Stage IA (T1a, N0, cM0) - Signed by Truitt Merle, MD on 03/26/2015        Breast cancer of lower-inner quadrant of right female breast ([place])   01/28/2015 Mammogram Screening mammogram showed new regional heterogeneous calcifications in the right breast central to the nipple midline depth.    02/06/2015 Initial Biopsy Right breast core needle biopsy showed DCIS with calcifications and necrosis.   02/07/2015 Initial Diagnosis Breast cancer of lower-inner quadrant of right female breast   02/10/2015 Imaging Breast MRI showed non-mass enhancement involves majority of the lower, outer quadrant of the right breast spanning at least 3.4 x 5.0 x 5.2 cm. No other lesion or adenopathy.   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 margins were negative for invasive carcinoma, but DCIS was involved in anterior margin, and broadly <0.1cm on other margins. Dr. Lucia Gaskins does not think there  is more to cut.     03/28/2015 -  Anti-estrogen oral therapy Tamoxifen 52m daily     HISTORY OF PRESENTING ILLNESS (02/11/2015):  Carolyn Berg 50y.o. female is here because of newly diagnosed right breast cancer. She presents to our multidisciplinary breast clinic to discuss further management. She is accompanied by her husband.  This was discovered by screening mammogram. She is very compliant with year screening mammogram. Her screening mammogram on 01/28/2015 showed a new regional heterogeneous calcification in the lower-outer quadrant of right breast. She underwent core needle biopsy on 02/07/2015, which showed DCIS.  She feels well overall, denies any symptoms, no pain, dyspnea, weight loss or night sweats. She is a housewife, very physically active, no significant past medical history, no family history of breast cancer. She is premenopausal, takes birth control pill.  CURRENT THERAPY: Tamoxifen 251mdaily, started on 03/28/2015   INTERIM HISTORY Ms. DeGarciaeturns for follow-up. She has been on tamoxifen for 2 months now and tolerating well. She has mild hot flash, no other noticeable side effects. She has good appetite and eating well.   MEDICAL HISTORY:  Past Medical History  Diagnosis Date  . Fibroids   . Anxiety     anticipation of mastectomy   . [facility] [place] [facility] [place] [facility] [place]  . Basal cell carcinoma of right thigh 05/2013    SURGICAL HISTORY: Past Surgical History  Procedure Laterality Date  . Finger fracture surgery Left     "middle finger"  . Skin biopsy Right 05/2013    basal cell on thigh  . Vaginal delivery      /  w epidural   . Wisdom tooth extraction  early 2000's    /w IV sedation   . Breast reconstruction with placement of tissue expander and flex hd (acellular hydrated dermis) Right 03/13/2015  . Mastectomy complete / simple w/ sentinel node biopsy Right 03/13/2015  . Breast surgery Right 01/2015  . Partial  mastectomy with axillary sentinel lymph node biopsy Right 03/13/2015    Procedure: RIGHT MASTECTOMY WITH AXILLARY SENTINEL LYMPH NODE BIOPSY;  Surgeon: Alphonsa Overall, MD;  Location: [facility];  Service: General;  Laterality: Right;  . Breast reconstruction with placement of tissue expander and flex hd (acellular hydrated dermis) Right 03/13/2015    Procedure: IMMEDIATE RIGHT BREAST RECONSTRUCTION WITH PLACEMENT OF TISSUE EXPANDER AND FLEX PLIABLE 4X16 ;  Surgeon: Loel Lofty Dillingham, DO;  Location: [facility];  Service: Plastics;  Laterality: Right;   GYN HISTORY  Menarchal:13 LMP: 05/27/2015 Contraceptive: 20+ years  HRT: N/A  G1P1: daughter is 40, no breast feeding, no plan for more children    SOCIAL HISTORY: Social History   Social History  . Marital Status: Married    Spouse Name: N/A  . Number of Children: 1 daughter 35 yo   . Years of Education: N/A   Occupational History  . Not on file.   Social History Main Topics  . Smoking status: Never Smoker   . Smokeless tobacco: Never Used  . Alcohol Use: Yes     Comment: occ glass of wine  . Drug Use: No  . Sexual Activity:    Partners: Male    Birth Control/ Protection: Pill   Other Topics Concern  . Not on file   Social History Narrative    FAMILY HISTORY: Family History  Problem Relation Age of Onset  . Diabetes Paternal Grandfather   . Heart attack Paternal Grandfather   . Colon cancer Maternal Grandfather 82    lived to 9  . Renal Disease Maternal Grandfather   . Breast cancer Other     MGF's mother dx over 14  . Lung cancer Maternal Uncle   . Stroke Maternal Grandmother     ALLERGIES:  has No Known Allergies.  MEDICATIONS:  Current Outpatient Prescriptions  Medication Sig Dispense Refill  . diazepam (VALIUM) 2 MG tablet Take 2 mg by mouth every evening. At bedtime    . HYDROcodone-acetaminophen (NORCO) 5-325 MG tablet Take 1 tablet by mouth.    Marland Kitchen ibuprofen (ADVIL,MOTRIN) 200 MG tablet Take 200 mg by mouth every  6 (six) hours as needed (2 tabs po prn pain).    . Influenza vac split quadrivalent PF (FLUARIX QUADRIVALENT) 0.5 ML injection FLUSHOT    . ondansetron (ZOFRAN) 4 MG tablet TAKE 1 TABLET BY MOUTH EVERY 8 HOURS AS NEEDED FOR NAUSEA AND VOMITING FOR UP TO 7 DAYS    . tamoxifen (NOLVADEX) 20 MG tablet Take 1 tablet (20 mg total) by mouth daily. 30 tablet 3   No current facility-administered medications for this visit.    REVIEW OF SYSTEMS:   Constitutional: Denies fevers, chills or abnormal night sweats Eyes: Denies blurriness of vision, double vision or watery eyes Ears, nose, mouth, throat, and face: Denies mucositis or sore throat Respiratory: Denies cough, dyspnea or wheezes Cardiovascular: Denies palpitation, chest discomfort or lower extremity swelling Gastrointestinal:  Denies nausea, heartburn or change in bowel habits Skin: Denies abnormal skin rashes Lymphatics: Denies new lymphadenopathy or easy bruising Neurological:Denies numbness, tingling or new weaknesses Behavioral/Psych: Mood is stable, no new changes  All other systems  were reviewed with the patient and are negative.  PHYSICAL EXAMINATION: ECOG PERFORMANCE STATUS: 1  Filed Vitals:   05/27/15 1521  BP: 104/65  Pulse: 75  Temp: 97.7 F (36.5 C)  Resp: 16   Filed Weights   05/27/15 1521  Weight: 118 lb 12.8 oz (53.887 kg)    GENERAL:alert, no distress and comfortable SKIN: skin color, texture, turgor are normal, no rashes or significant lesions EYES: normal, conjunctiva are pink and non-injected, sclera clear OROPHARYNX:no exudate, no erythema and lips, buccal mucosa, and tongue normal  NECK: supple, thyroid normal size, non-tender, without nodularity LYMPH:  no palpable lymphadenopathy in the cervical, axillary or inguinal LUNGS: clear to auscultation and percussion with normal breathing effort HEART: regular rate & rhythm and no murmurs and no lower extremity edema ABDOMEN:abdomen soft, non-tender and  normal bowel sounds Musculoskeletal:no cyanosis of digits and no clubbing  PSYCH: alert & oriented x 3 with fluent speech NEURO: no focal motor/sensory deficits Breasts: s/p right maostectomy, incision site has healed well, clean, no discharge, no skin erythema. Palpation of left breast and axilla revealed no obvious mass that I could appreciate.   LABORATORY DATA:  I have reviewed the data as listed CBC Latest Ref Rng 05/27/2015 03/13/2015 03/11/2015  WBC 3.9 - 10.3 10e3/uL 5.0 11.4(H) 5.1  Hemoglobin 11.6 - 15.9 g/dL 11.8 11.0(L) 11.9(L)  Hematocrit 34.8 - 46.6 % 35.4 33.1(L) 35.3(L)  Platelets 145 - 400 10e3/uL 291 292 288    CMP Latest Ref Rng 05/27/2015 03/13/2015 03/11/2015  Glucose 70 - 140 mg/dl 93 164(H) 102(H)  BUN 7.0 - 26.0 mg/dL 16.5 7 10   Creatinine 0.6 - 1.1 mg/dL 0.9 0.97 0.94  Sodium 136 - 145 mEq/L 142 134(L) 136  Potassium 3.5 - 5.1 mEq/L 4.2 4.5 4.0  Chloride 101 - 111 mmol/L - 102 102  CO2 22 - 29 mEq/L 28 24 27   Calcium 8.4 - 10.4 mg/dL 9.0 8.6(L) 9.0  Total Protein 6.4 - 8.3 g/dL 7.1 - -  Total Bilirubin 0.20 - 1.20 mg/dL 0.39 - -  Alkaline Phos 40 - 150 U/L 50 - -  AST 5 - 34 U/L 19 - -  ALT 0 - 55 U/L 13 - -      PATHOLOGY REPORT  Diagnosis 03/13/2015 1. Lymph node, sentinel, biopsy, right axillary - ONE BENIGN LYMPH NODE WITH NO TUMOR SEEN (0/1). 2. Breast, simple mastectomy, right - INVASIVE GRADE II DUCTAL CARCINOMA, SPANNING 0.4 CM IN GREATEST DIMENSION. - EXTENSIVE INTERMEDIATE GRADE DUCTAL CARCINOMA IN SITU WITH COMEDONECROSIS AND CALCIFICATIONS. - DUCTAL CARCINOMA IN SITU FOCALLY INVOLVES THE ANTERIOR MARGIN AND BROADLY IS LESS THAN 0.1 CM AWAY FROM THE ANTERIOR MARGIN. - DUCTAL CARCINOMA IN SITU IS BROADLY LESS THAN 0.1 CM AWAY FROM THE POSTERIOR MARGIN AND THE MARGIN CANNOT BE CLEARED OF FOCAL INVOLVEMENT WITH DUCTAL CARCINOMA IN SITU. - OTHER MARGINS ARE NEGATIVE. - SEE ONCOLOGY TEMPLATE AND COMMENT. 3. Skin , right breast skin flaps -  BENIGN SKIN WITH UNDERLYING BENIGN BREAST TISSUE. - NO DYSPLASIA, ATYPIA OR TUMOR SEEN.  Microscopic Comment 2. BREAST, INVASIVE TUMOR, WITH LYMPH NODES PRESENT Specimen, including laterality and lymph node sampling (sentinel, non-sentinel): Right breast with right sentinel lymph node and skin flaps. Procedure: Right mastectomy with axillary sentinel lymph node biopsy. Histologic type: Invasive ductal carcinoma. Grade: 2. Tubule formation: 3. Nuclear pleomorphism: 2. Mitotic: 1. Tumor size (glass slide measurement): 0.4 cm. Margins: Invasive, distance to closest margin: At least 0.3 cm (posterior margin). In-situ, distance to closest margin:  Ductal carcinoma in situ focally involves the anterior soft tissue margin; there is a focal area of the posterior margin which cannot be effectively cleared of involvement, see below comment. If margin positive, focally or broadly: Focally positive. Lymphovascular invasion: Not identified. Ductal carcinoma in situ: Yes. Grade: Intermediate grade. Extensive intraductal component: Yes. Lobular neoplasia: Not identified. Tumor focality: Invasive tumor is unifocal. Treatment effect: Not applicable. Extent of tumor: Tumor confined to breast parenchyma. Skin: Not involved. Nipple: Not involved. Skeletal muscle: Not involved. Lymph nodes: Examined: 1 Sentinel. 0 Non-sentinel. 1 Total. Lymph nodes with metastasis: 0. Isolated tumor cells (< 0.2 mm): 0. Micrometastasis: (> 0.2 mm and < 2.0 mm): 0. Macrometastasis: (> 2.0 mm): 0. Extracapsular extension: Not applicable. Breast prognostic profile: Will be performed on current invasive tumor. Estrogen receptor: Will be performed on current invasive tumor. Progesterone receptor: Will be performed on current invasive tumor. Her 2 neu: Will be performed on current invasive tumor. Ki-67: Will be performed on current invasive tumor. Additional breast findings: Fibrocystic changes with usual ductal  hyperplasia. TNM: pT1a, pN0. Comments: There is a focal area of atypical cauterized glands where involvement with ductal carcinoma in situ of the posterior margin cannot be excluded (the margin cannot be effectively cleared). A breast prognostic profile will be performed on the invasive ductal carcinoma. This will be reported in an addendum to follow. (RH:ds 03/14/15)  Results: IMMUNOHISTOCHEMICAL AND MORPHOMETRIC ANALYSIS PERFORMED MANUALLY Estrogen Receptor: 90%, POSITIVE, STRONG STAINING INTENSITY Progesterone Receptor: 90%, POSITIVE, STRONG STAINING INTENSITY Proliferation Marker Ki67: 10%  Results: HER2 - NEGATIVE RATIO OF HER2/CEP17 SIGNALS 1.28 AVERAGE HER2 COPY NUMBER PER CELL 2.95  RADIOGRAPHIC STUDIES: I have personally reviewed the radiological images as listed and agreed with the findings in the report.  Mr Breast Bilateral W Wo Contrast 02/10/2015    FINDINGS: Breast composition: d. Extreme fibroglandular tissue.  Background parenchymal enhancement: Marked  Right breast: There is asymmetric non mass enhancement involving the majority of the lower, outer quadrant of the right breast when compared to the left. This non mass enhancement is difficult to precisely measure given the marked background enhancement. However, the non mass enhancement appears to span at least 3.4 (AP) x 5.0 (TR) x 5.2 (CC) cm. Biopsy marker artifact corresponding to the known site of DCIS is seen within the inferolateral aspect of this enhancement in the lower, outer right breast, series 5, image 115.  Left breast: No dominant mass or suspicious enhancement. There is marked background parenchymal enhancement.  Lymph nodes: No abnormal appearing lymph nodes.  Ancillary findings:  None.   IMPRESSION: Asymmetric, non mass enhancement involves the majority of the lower, outer quadrant of the right breast spanning at least 3.4 x 5.0 x 5.2 cm. This non mass enhancement appears to correspond to the extent of  calcifications seen mammographically. If breast conservation is being considered, additional stereotactic biopsy of the right breast may be warranted for further evaluation of extent of disease.  RECOMMENDATION: Treatment planning.  BI-RADS CATEGORY  6: Known biopsy-proven malignancy.   Electronically Signed   By: Pamelia Hoit M.D.   On: 02/10/2015 13:24   ASSESSMENT AND PLAN : 50 year old Caucasian female with screening detected right breast cancer  1. 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  --Given her strong ER/PR positivity, I strongly recommend her consider adjuvant endocrine therapy. She is premenopausal, I recommend tamoxifen for  total of 10 years, or switch tamoxifen to aromatase inhibitor for additional 5 years when she becomes postmenopausal down the road. -she has been tolerating tamoxifen well, not hot flash, o other side effects, we'll continue. -continue breast cancer surveillance with annual mammogram, and also consider breast MI due to her dense breast tissue if her insurance coverage.   2. Genetics -Her genetic testing was negative for inheritable breast/ovarian syndrome.  Plan: I'll see her back in 3 months for follow up with lab.   All questions were answered. The patient knows to call the clinic with any problems, questions or concerns. I spent 15 minutes counseling the patient face to face. The total time spent in the appointment was 20 minutes and more than 50% was on counseling.       Truitt Merle, MD 05/27/2015

## 2015-05-29 ENCOUNTER — Encounter: Payer: BLUE CROSS/BLUE SHIELD | Admitting: Nurse Practitioner

## 2015-06-05 ENCOUNTER — Encounter: Payer: Self-pay | Admitting: Nurse Practitioner

## 2015-06-05 ENCOUNTER — Ambulatory Visit (HOSPITAL_BASED_OUTPATIENT_CLINIC_OR_DEPARTMENT_OTHER): Payer: BLUE CROSS/BLUE SHIELD | Admitting: Nurse Practitioner

## 2015-06-05 VITALS — BP 113/60 | HR 89 | Temp 98.3°F | Resp 18 | Ht 64.0 in | Wt 121.3 lb

## 2015-06-05 DIAGNOSIS — Z79810 Long term (current) use of selective estrogen receptor modulators (SERMs): Secondary | ICD-10-CM | POA: Diagnosis not present

## 2015-06-05 DIAGNOSIS — C50311 Malignant neoplasm of lower-inner quadrant of right female breast: Secondary | ICD-10-CM

## 2015-06-05 DIAGNOSIS — Z170 Estrogen receptor positive status [ER+]: Secondary | ICD-10-CM | POA: Diagnosis not present

## 2015-06-05 NOTE — Progress Notes (Signed)
CLINIC:  Cancer Survivorship   REASON FOR VISIT:  Routine follow-up post-treatment for a recent history of breast cancer.  BRIEF ONCOLOGIC HISTORY:  Oncology History   Breast cancer of lower-inner quadrant of right female breast [facility])   Staging form: Breast, AJCC 7th Edition     Clinical stage from 02/12/2015: Stage 0 (Tis (DCIS), N0, M0) - Unsigned       Staging comments: Staged at breast conference on 9.21.16      Pathologic stage from 03/13/2015: Stage IA (T1a, N0, cM0) - Signed by Truitt Merle, MD on 03/26/2015        Breast cancer of lower-inner quadrant of right female breast ([place])   01/28/2015 Mammogram Screening mammogram showed new regional heterogeneous calcifications in the right breast central to the nipple midline depth.    02/06/2015 Initial Biopsy Right breast core needle biopsy showed DCIS with calcifications and necrosis.   02/07/2015 Breast MRI Breast MRI showed non-mass enhancement involves majority of the lower, outer quadrant of the right breast spanning at least 3.4 x 5.0 x 5.2 cm. No other lesion or adenopathy.   02/07/2015 Clinical Stage Stage 0: Tis N0   02/12/2015 Procedure Breast/Ovarian Next panel reveals no clinically significant variant at ATM, BARD1, BRCA1, BRCA2, BRIP1, CDH1, CHEK2, EPCAM, FANCC, MLH1, MSH2, MSH6, NBN, PALB2, PMS2, PTEN, RAD51C, RAD51D, TP53, and XRCC2   03/13/2015 Definitive Surgery Right mastectomy/SLNB: invasive ductal carcinoma, 0.4cm, G2, ER/PR+, HER2neu neg, Ki67 10%, extensive DCIS, G2. margins were negative for invasive carcinoma, but DCIS was involved in anterior margin, and broadly <0.1cm on other margins - none to cut   03/13/2015 Pathologic Stage Stage IA: T1a N0   04/24/2015 -  Anti-estrogen oral therapy Tamoxifen 43m daily. Potential change to AI once post menopausal. Planned duration of therapy 10 years.   06/05/2015 Survivorship Survivorship visit completed and copy of care plan given to patient.    INTERVAL HISTORY:  [facility] meeting to review her survivorship care plan detailing her treatment course for breast cancer, as well as monitoring long-term side effects of that treatment, education regarding health maintenance, screening, and overall wellness and health promotion.     Overall, Ms. DQuarryreports feeling quite well since her mastectomy approximately three months ago. She continues to follow with Dr. DMarla Roefor her reconstruction and will undergo exchange later this spring.  Thus far, she is tolerating it well and had her last fill last Friday.  She has developed a seroma in her right axilla along her scar and has been evaluated by Dr. NLucia Gaskinswho is monitoring it at this time.  She denies any headache, cough, shortness of breath or bone pain.  She has a good appetite and denies any weight loss.  She began her tamoxifen in December.  She is having hot flashes, predominantly at night, but they are bearable. She denies any excessive vaginal discharge and has had no vaginal bleeding. She does have some limited range of motion in her right shoulder and although she has begun exercising again, is limiting her upper body activity due to physical constraints of her reconstruction.  REVIEW OF SYSTEMS:  General: Hot flashes as above. Denies fever, chills, or unintentional weight loss. HEENT: Denies visual changes, hearing loss, mouth sores or difficulty swallowing. Cardiac: Denies palpitations, chest pain, and lower extremity edema.  Respiratory: Denies wheeze or dyspnea on exertion.  Breast: Tenderness along her implant from recent fill and along her right axilla  at site of seroma.  Otherwise, denies any new nodularity, masses, tenderness, nipple changes, or nipple discharge.  GI: Denies abdominal pain, constipation, diarrhea, nausea, or vomiting.  GU: Denies dysuria, hematuria, vaginal bleeding, vaginal discharge, or vaginal dryness.  Musculoskeletal: Denies joint or  bone pain.  Neuro: Denies recent fall or numbness / tingling in her extremities. Skin: Denies rash, pruritis, or open wounds.  Psych: Denies depression, anxiety, insomnia, or memory loss.   A 14-point review of systems was completed and was negative, except as noted above.   ONCOLOGY TREATMENT TEAM:  1. Surgeon:  Dr. Lucia Gaskins at [facility] and Dr. Marla Roe at [facility] ([place]) 2. Medical Oncologist: Dr. Burr Medico     PAST MEDICAL/SURGICAL HISTORY:  Past Medical History  Diagnosis Date  . Fibroids   . Anxiety     anticipation of mastectomy   . Breast cancer of lower-inner quadrant of right female breast ([place]) 02/07/2015  . Basal cell carcinoma of right thigh 05/2013   Past Surgical History  Procedure Laterality Date  . Finger fracture surgery Left     "middle finger"  . Skin biopsy Right 05/2013    basal cell on thigh  . Vaginal delivery      /w epidural   . Wisdom tooth extraction  early 2000's    /w IV sedation   . Breast reconstruction with placement of tissue expander and flex hd (acellular hydrated dermis) Right 03/13/2015  . Mastectomy complete / simple w/ sentinel node biopsy Right 03/13/2015  . Breast surgery Right 01/2015  . Partial mastectomy with axillary sentinel lymph node biopsy Right 03/13/2015    Procedure: RIGHT MASTECTOMY WITH AXILLARY SENTINEL LYMPH NODE BIOPSY;  Surgeon: Alphonsa Overall, MD;  Location: [place];  Service: General;  Laterality: Right;  . Breast reconstruction with placement of tissue expander and flex hd (acellular hydrated dermis) Right 03/13/2015    Procedure: IMMEDIATE RIGHT BREAST RECONSTRUCTION WITH PLACEMENT OF TISSUE EXPANDER AND FLEX PLIABLE 4X16 ;  Surgeon: Loel Lofty Dillingham, DO;  Location: [place];  Service: Plastics;  Laterality: Right;     ALLERGIES:  No Known Allergies   CURRENT MEDICATIONS:  Current Outpatient Prescriptions on File Prior to Visit  Medication Sig Dispense Refill  . HYDROcodone-acetaminophen (NORCO) 5-325  MG tablet Take 1 tablet by mouth as needed.     Marland Kitchen ibuprofen (ADVIL,MOTRIN) 200 MG tablet Take 200 mg by mouth every 6 (six) hours as needed (2 tabs po prn pain).    . Multiple Vitamin (MULTIVITAMIN) tablet Take 1 tablet by mouth daily.    . ondansetron (ZOFRAN) 4 MG tablet Reported on 05/27/2015    . tamoxifen (NOLVADEX) 20 MG tablet Take 1 tablet (20 mg total) by mouth daily. 30 tablet 3   No current facility-administered medications on file prior to visit.     ONCOLOGIC FAMILY HISTORY:  Family History  Problem Relation Age of Onset  . Diabetes Paternal Grandfather   . Heart attack Paternal Grandfather   . Colon cancer Maternal Grandfather 69    lived to 30  . Renal Disease Maternal Grandfather   . Breast cancer Other     MGF's mother dx over 59  . Lung cancer Maternal Uncle   . Stroke Maternal Grandmother      GENETIC COUNSELING/TESTING: Yes, performed 02/12/2015. Breast/Ovarian Next panel reveals no clinically significant variant at ATM, BARD1, BRCA1, BRCA2, BRIP1, CDH1, CHEK2, EPCAM, FANCC, MLH1, MSH2, MSH6, NBN, PALB2, PMS2, PTEN, RAD51C, RAD51D, TP53, and XRCC2   SOCIAL HISTORY:  Blondine L Nish is married and lives with her family in [place], [place].  She has 1 child. [name] is currently not working.  She denies any current or history of tobacco or illicit drug use.  She uses alcohol approximately 4-5 times / year.   PHYSICAL EXAMINATION:  Vital Signs: Filed Vitals:   06/05/15 1329  BP: 113/60  Pulse: 89  Temp: 98.3 F (36.8 C)  Resp: 18   ECOG Performance Status: 0  General: Well-nourished, well-appearing female in no acute distress.  She is unaccompanied today.   HEENT: Head is atraumatic and normocephalic.  Pupils equal and reactive to light and accomodation. Conjunctivae clear without exudate.  Sclerae anicteric. Oral mucosa is pink, moist, and intact without lesions.  Oropharynx is pink without lesions or erythema.  Lymph: No cervical,  supraclavicular, infraclavicular, or axillary lymphadenopathy noted on palpation. Seroma (approximately 2 cm) present at right axilla, smooth. Tender to palpation.  Cardiovascular: Regular rate and rhythm without murmurs, rubs, or gallops. Respiratory: Clear to auscultation bilaterally. Chest expansion symmetric without accessory muscle use on inspiration or expiration.  GI: Abdomen soft and round. No tenderness to palpation. Bowel sounds normoactive in 4 quadrants.  GU: Deferred.  Neuro: No focal deficits. Steady gait.  Psych: Mood and affect normal and appropriate for situation.  Extremities: No edema, cyanosis, or clubbing.  Skin: Warm and dry. No open lesions noted.   LABORATORY DATA:  None for this visit.  DIAGNOSTIC IMAGING:  None for this visit.     ASSESSMENT AND PLAN:   1. 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  Ms. Mackert is doing well without clinical symptoms worrisome for disease recurrence. She will follow-up with her medical oncologist, Dr. Burr Medico, in April 2017 with history and physical examination per surveillance protocol.  She will continue to follow with Drs. Dillingham and [employer], as well. As above, she will complete reconstruction later this spring with potential surgical excision of the seroma at that time. She will continue her anti-estrogen therapy with tamoxifen as prescribed by Dr. Burr Medico at this time. We have discussed management of her hot flashes. She was instructed to make Dr. Burr Medico or myself aware if she begins to experience any new or increased side effects of the medication and I could see her back in clinic to help manage those side effects, as needed. Though the incidence is low, there is an associated risk of endometrial cancer with anti-estrogen therapies like  Tamoxifen.  Ms. Heming was encouraged to contact Dr. Burr Medico or myself with any vaginal bleeding while taking Tamoxifen. Other side effects of Tamoxifen were again reviewed with her as well. A comprehensive survivorship care plan and treatment summary was reviewed with the patient today detailing her breast cancer diagnosis, treatment course, potential late/long-term effects of treatment, appropriate follow-up care with recommendations for the future, and patient education resources.  A copy of this summary, along with a letter will be sent to the patient's primary care provider via in basket message after today's visit.  Ms. Contino is welcome to return to the Survivorship Clinic in the future, as needed; no follow-up will be scheduled at this time.    2. Cancer screening:  Due to Ms. Baccari's history and her age, she should receive screening for skin cancers, colon cancer (beginning at age 83), and gynecologic cancers.  The information and recommendations are listed on the patient's comprehensive care plan/treatment summary and were reviewed in detail with the patient.    3. Health maintenance and wellness promotion: Ms. Marik was encouraged to consume 5-7 servings of fruits and vegetables per day. We reviewed the "Nutrition Rainbow" handout, as well as discussed recommendations to maximize nutrition and minimize recurrence, such as increased intake of fruits, vegetables, lean proteins, and minimizing the intake of red meats and processed foods.  She was also encouraged to continue engage in moderate to vigorous exercise for 30 minutes per day most days of the week. She is looking forward to being able to do more physically upon completion of her reconstruction.  She was instructed to limit her alcohol consumption and continue to abstain from tobacco use.  A copy of the "Take Control of Your Health" brochure was given to her reinforcing these recommendations.   4. Support services/counseling: It is not uncommon for  this period of the patient's cancer care trajectory to be one of many emotions and stressors.  We discussed an opportunity for her to participate in the next session of [facility] ("Finding Your New Normal") support group series designed for patients after they have completed treatment.  Ms. Heick was encouraged to take advantage of our many other support services programs, support groups, and/or counseling in coping with her new life as a cancer survivor after completing anti-cancer treatment.  She was offered support today through active listening and expressive supportive counseling.  She was given information regarding our available services and encouraged to contact me with any questions or for help enrolling in any of our support group/programs.    A total of 50 minutes of face-to-face time was spent with this patient with greater than 50% of that time in counseling and care-coordination.   Sylvan Cheese, NP  Survivorship Program [place] [phone]   Note: PRIMARY CARE PROVIDER [facility] [phone]

## 2015-06-16 ENCOUNTER — Encounter (HOSPITAL_BASED_OUTPATIENT_CLINIC_OR_DEPARTMENT_OTHER): Payer: Self-pay | Admitting: *Deleted

## 2015-06-19 ENCOUNTER — Other Ambulatory Visit: Payer: Self-pay | Admitting: Plastic Surgery

## 2015-06-19 DIAGNOSIS — Z9011 Acquired absence of right breast and nipple: Secondary | ICD-10-CM

## 2015-06-19 NOTE — H&P (Signed)
Carolyn Berg is an 50 y.o. female.   Chief Complaint: acquired absence of right breast HPI: The patient is a 50 yrs old wf here for treatment after breast cancer.  She underwent a right mastectomy with tissue expander and ADM placement 02/2015.  She has done well with expansion and is ready for her exchange.  She has 130 cc of saline in the right expander.  History:  She underwent a routine mammogram and was found to have right lower-inner quadrant calcifications (01/2015). 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 Genetic were negative. She is not a smoker. She is otherwise healthy. She is married and has a daughter (16 yrs). She is very fit, skinny and has very small breasts with no ptosis at all.   Past Medical History  Diagnosis Date  . Fibroids   . Anxiety     anticipation of mastectomy   . Breast cancer of lower-inner quadrant of right female breast (Carolyn Berg) 02/07/2015  . Basal cell carcinoma of right thigh 05/2013    Past Surgical History  Procedure Laterality Date  . Finger fracture surgery Left     "middle finger"  . Skin biopsy Right 05/2013    basal cell on thigh  . Vaginal delivery      /w epidural   . Wisdom tooth extraction  early 2000's    /w IV sedation   . Breast reconstruction with placement of tissue expander and flex hd (acellular hydrated dermis) Right 03/13/2015  . Mastectomy complete / simple w/ sentinel node biopsy Right 03/13/2015  . Breast surgery Right 01/2015  . Partial mastectomy with axillary sentinel lymph node biopsy Right 03/13/2015    Procedure: RIGHT MASTECTOMY WITH AXILLARY SENTINEL LYMPH NODE BIOPSY;  Surgeon: Alphonsa Overall, MD;  Location: [place];  Service: General;  Laterality: Right;  . Breast reconstruction with placement of tissue expander and flex hd (acellular hydrated dermis) Right 03/13/2015    Procedure: IMMEDIATE RIGHT BREAST RECONSTRUCTION WITH PLACEMENT OF TISSUE EXPANDER AND FLEX PLIABLE 4X16 ;  Surgeon: Loel Lofty Wenonah Milo,  DO;  Location: [place];  Service: Plastics;  Laterality: Right;    Family History  Problem Relation Age of Onset  . Diabetes Paternal Grandfather   . Heart attack Paternal Grandfather   . Colon cancer Maternal Grandfather 14    lived to 8  . Renal Disease Maternal Grandfather   . Breast cancer Other     MGF's mother dx over 64  . Lung cancer Maternal Uncle   . Stroke Maternal Grandmother    Social History:  reports that she has never smoked. She has never used smokeless tobacco. She reports that she drinks alcohol. She reports that she does not use illicit drugs.  Allergies: No Known Allergies  No prescriptions prior to admission    No results found for this or any previous visit (from the past 48 hour(s)). No results found.  Review of Systems  Constitutional: Negative.   HENT: Negative.   Eyes: Negative.   Respiratory: Negative.   Cardiovascular: Negative.   Gastrointestinal: Negative.   Genitourinary: Negative.   Musculoskeletal: Negative.   Skin: Negative.   Neurological: Negative.   Psychiatric/Behavioral: Negative.     Height 5\' 4"  (1.626 m), weight 54.885 kg (121 lb), last menstrual period 04/21/2015. Physical Exam  Constitutional: She is oriented to person, place, and time. She appears well-developed and well-nourished.  HENT:  Head: Normocephalic and atraumatic.  Eyes: Conjunctivae and EOM are normal. Pupils are equal, round,  and reactive to light.  Cardiovascular: Normal rate.   Respiratory: Effort normal.  GI: Soft.  Musculoskeletal: Normal range of motion.  Neurological: She is alert and oriented to person, place, and time.  Skin: Skin is warm.  Psychiatric: She has a normal mood and affect. Her behavior is normal. Judgment and thought content normal.     Assessment/Plan Plan for removal of the right expander (130 cc) and placement of a silicone implant.  Wallace Going 06/19/2015, 3:53 PM

## 2015-06-23 ENCOUNTER — Ambulatory Visit (HOSPITAL_BASED_OUTPATIENT_CLINIC_OR_DEPARTMENT_OTHER)
Admission: RE | Admit: 2015-06-23 | Discharge: 2015-06-23 | Disposition: A | Discharge status: Home / Self Care | Payer: BLUE CROSS/BLUE SHIELD | Source: Ambulatory Visit | Attending: Plastic Surgery | Admitting: Plastic Surgery

## 2015-06-23 ENCOUNTER — Encounter (HOSPITAL_BASED_OUTPATIENT_CLINIC_OR_DEPARTMENT_OTHER): Payer: Self-pay | Admitting: Plastic Surgery

## 2015-06-23 ENCOUNTER — Encounter (HOSPITAL_BASED_OUTPATIENT_CLINIC_OR_DEPARTMENT_OTHER)
Admission: RE | Disposition: A | Discharge status: Home / Self Care | Payer: Self-pay | Source: Ambulatory Visit | Attending: Plastic Surgery

## 2015-06-23 ENCOUNTER — Ambulatory Visit (HOSPITAL_BASED_OUTPATIENT_CLINIC_OR_DEPARTMENT_OTHER): Payer: BLUE CROSS/BLUE SHIELD | Admitting: Anesthesiology

## 2015-06-23 DIAGNOSIS — F419 Anxiety disorder, unspecified: Secondary | ICD-10-CM | POA: Insufficient documentation

## 2015-06-23 DIAGNOSIS — Z853 Personal history of malignant neoplasm of breast: Secondary | ICD-10-CM | POA: Insufficient documentation

## 2015-06-23 DIAGNOSIS — Z9011 Acquired absence of right breast and nipple: Secondary | ICD-10-CM | POA: Diagnosis not present

## 2015-06-23 DIAGNOSIS — Z85828 Personal history of other malignant neoplasm of skin: Secondary | ICD-10-CM | POA: Insufficient documentation

## 2015-06-23 DIAGNOSIS — Z421 Encounter for breast reconstruction following mastectomy: Secondary | ICD-10-CM | POA: Diagnosis not present

## 2015-06-23 HISTORY — PX: REMOVAL OF TISSUE EXPANDER AND PLACEMENT OF IMPLANT: SHX6457

## 2015-06-23 SURGERY — REMOVAL, TISSUE EXPANDER, BREAST, WITH IMPLANT INSERTION
Anesthesia: General | Site: Chest | Laterality: Right

## 2015-06-23 MED ORDER — HYDROMORPHONE HCL 1 MG/ML IJ SOLN
INTRAMUSCULAR | Status: AC
  Filled 2015-06-23: qty 1

## 2015-06-23 MED ORDER — EPHEDRINE SULFATE 50 MG/ML IJ SOLN
INTRAMUSCULAR | Status: DC | PRN
  Administered 2015-06-23: 10 mg via INTRAVENOUS

## 2015-06-23 MED ORDER — PROPOFOL 10 MG/ML IV BOLUS
INTRAVENOUS | Status: DC | PRN
  Administered 2015-06-23: 160 mg via INTRAVENOUS

## 2015-06-23 MED ORDER — MIDAZOLAM HCL 2 MG/2ML IJ SOLN
INTRAMUSCULAR | Status: AC
  Filled 2015-06-23: qty 2

## 2015-06-23 MED ORDER — LACTATED RINGERS IV SOLN
INTRAVENOUS | Status: DC
  Administered 2015-06-23: 10 mL/h via INTRAVENOUS
  Administered 2015-06-23: 12:00:00 via INTRAVENOUS

## 2015-06-23 MED ORDER — ONDANSETRON HCL 4 MG/2ML IJ SOLN: 4.0000 mg | Freq: Four times a day (QID) | INTRAMUSCULAR | Status: DC | PRN

## 2015-06-23 MED ORDER — DEXAMETHASONE SODIUM PHOSPHATE 4 MG/ML IJ SOLN
INTRAMUSCULAR | Status: DC | PRN
  Administered 2015-06-23: 10 mg via INTRAVENOUS

## 2015-06-23 MED ORDER — CEFAZOLIN SODIUM-DEXTROSE 2-3 GM-% IV SOLR
2.0000 g | INTRAVENOUS | Status: AC
Start: 2015-06-23 — End: 2015-06-23
  Administered 2015-06-23: 2 g via INTRAVENOUS

## 2015-06-23 MED ORDER — CEFAZOLIN SODIUM-DEXTROSE 2-3 GM-% IV SOLR
INTRAVENOUS | Status: AC
  Filled 2015-06-23: qty 50

## 2015-06-23 MED ORDER — MIDAZOLAM HCL 2 MG/2ML IJ SOLN
1.0000 mg | INTRAMUSCULAR | Status: DC | PRN
  Administered 2015-06-23: 2 mg via INTRAVENOUS

## 2015-06-23 MED ORDER — PHENYLEPHRINE HCL 10 MG/ML IJ SOLN
INTRAMUSCULAR | Status: DC | PRN
  Administered 2015-06-23 (×3): 80 ug via INTRAVENOUS

## 2015-06-23 MED ORDER — ONDANSETRON HCL 4 MG/2ML IJ SOLN
INTRAMUSCULAR | Status: AC
  Filled 2015-06-23: qty 2

## 2015-06-23 MED ORDER — DEXAMETHASONE SODIUM PHOSPHATE 10 MG/ML IJ SOLN
INTRAMUSCULAR | Status: AC
  Filled 2015-06-23: qty 1

## 2015-06-23 MED ORDER — GLYCOPYRROLATE 0.2 MG/ML IJ SOLN: 0.2000 mg | Freq: Once | INTRAMUSCULAR | Status: DC | PRN

## 2015-06-23 MED ORDER — BUPIVACAINE-EPINEPHRINE 0.25% -1:200000 IJ SOLN
INTRAMUSCULAR | Status: DC | PRN
  Administered 2015-06-23: 3 mL

## 2015-06-23 MED ORDER — FENTANYL CITRATE (PF) 100 MCG/2ML IJ SOLN
INTRAMUSCULAR | Status: AC
  Filled 2015-06-23: qty 2

## 2015-06-23 MED ORDER — BUPIVACAINE-EPINEPHRINE (PF) 0.25% -1:200000 IJ SOLN
INTRAMUSCULAR | Status: AC
  Filled 2015-06-23: qty 30

## 2015-06-23 MED ORDER — OXYCODONE HCL 5 MG PO TABS: 5.0000 mg | ORAL_TABLET | Freq: Once | ORAL | Status: DC | PRN

## 2015-06-23 MED ORDER — LIDOCAINE HCL (CARDIAC) 20 MG/ML IV SOLN
INTRAVENOUS | Status: AC
  Filled 2015-06-23: qty 5

## 2015-06-23 MED ORDER — FENTANYL CITRATE (PF) 100 MCG/2ML IJ SOLN
50.0000 ug | INTRAMUSCULAR | Status: DC | PRN
  Administered 2015-06-23: 100 ug via INTRAVENOUS

## 2015-06-23 MED ORDER — OXYCODONE HCL 5 MG/5ML PO SOLN: 5.0000 mg | Freq: Once | ORAL | Status: DC | PRN

## 2015-06-23 MED ORDER — SODIUM CHLORIDE 0.9 % IR SOLN
Status: DC | PRN
  Administered 2015-06-23: 500 mL

## 2015-06-23 MED ORDER — HYDROMORPHONE HCL 1 MG/ML IJ SOLN
0.2500 mg | INTRAMUSCULAR | Status: DC | PRN
  Administered 2015-06-23 (×2): 0.5 mg via INTRAVENOUS

## 2015-06-23 MED ORDER — PROPOFOL 10 MG/ML IV BOLUS
INTRAVENOUS | Status: AC
  Filled 2015-06-23: qty 20

## 2015-06-23 MED ORDER — SCOPOLAMINE 1 MG/3DAYS TD PT72: 1.0000 | MEDICATED_PATCH | Freq: Once | TRANSDERMAL | Status: DC | PRN

## 2015-06-23 MED ORDER — ONDANSETRON HCL 4 MG/2ML IJ SOLN
INTRAMUSCULAR | Status: DC | PRN
  Administered 2015-06-23: 4 mg via INTRAVENOUS

## 2015-06-23 MED ORDER — LIDOCAINE HCL (CARDIAC) 20 MG/ML IV SOLN
INTRAVENOUS | Status: DC | PRN
  Administered 2015-06-23: 60 mg via INTRAVENOUS

## 2015-06-23 SURGICAL SUPPLY — 71 items
ADH SKN CLS APL DERMABOND .7 (GAUZE/BANDAGES/DRESSINGS) ×1
BAG DECANTER FOR FLEXI CONT (MISCELLANEOUS) ×2 IMPLANT
BINDER BREAST LRG (GAUZE/BANDAGES/DRESSINGS) IMPLANT
BINDER BREAST MEDIUM (GAUZE/BANDAGES/DRESSINGS) ×1 IMPLANT
BINDER BREAST XLRG (GAUZE/BANDAGES/DRESSINGS) IMPLANT
BINDER BREAST XXLRG (GAUZE/BANDAGES/DRESSINGS) IMPLANT
BIOPATCH RED 1 DISK 7.0 (GAUZE/BANDAGES/DRESSINGS) IMPLANT
BLADE SURG 15 STRL LF DISP TIS (BLADE) ×1 IMPLANT
BLADE SURG 15 STRL SS (BLADE) ×4
BNDG GAUZE ELAST 4 BULKY (GAUZE/BANDAGES/DRESSINGS) ×4 IMPLANT
CANISTER SUCT 1200ML W/VALVE (MISCELLANEOUS) ×2 IMPLANT
CHLORAPREP W/TINT 26ML (MISCELLANEOUS) ×2 IMPLANT
COVER BACK TABLE 60X90IN (DRAPES) ×2 IMPLANT
COVER MAYO STAND STRL (DRAPES) ×2 IMPLANT
DECANTER SPIKE VIAL GLASS SM (MISCELLANEOUS) IMPLANT
DERMABOND ADVANCED (GAUZE/BANDAGES/DRESSINGS) ×1
DERMABOND ADVANCED .7 DNX12 (GAUZE/BANDAGES/DRESSINGS) IMPLANT
DRAIN CHANNEL 19F RND (DRAIN) IMPLANT
DRAPE LAPAROSCOPIC ABDOMINAL (DRAPES) ×2 IMPLANT
DRSG PAD ABDOMINAL 8X10 ST (GAUZE/BANDAGES/DRESSINGS) ×4 IMPLANT
ELECT BLADE 4.0 EZ CLEAN MEGAD (MISCELLANEOUS) ×2
ELECT COATED BLADE 2.86 ST (ELECTRODE) ×2 IMPLANT
ELECT REM PT RETURN 9FT ADLT (ELECTROSURGICAL) ×2
ELECTRODE BLDE 4.0 EZ CLN MEGD (MISCELLANEOUS) ×1 IMPLANT
ELECTRODE REM PT RTRN 9FT ADLT (ELECTROSURGICAL) ×1 IMPLANT
EVACUATOR SILICONE 100CC (DRAIN) IMPLANT
GLOVE BIO SURGEON STRL SZ 6.5 (GLOVE) ×4 IMPLANT
GLOVE BIOGEL M STRL SZ7.5 (GLOVE) ×1 IMPLANT
GLOVE BIOGEL PI IND STRL 8 (GLOVE) IMPLANT
GLOVE BIOGEL PI INDICATOR 8 (GLOVE) ×1
GOWN STRL REUS W/ TWL LRG LVL3 (GOWN DISPOSABLE) ×2 IMPLANT
GOWN STRL REUS W/ TWL XL LVL3 (GOWN DISPOSABLE) IMPLANT
GOWN STRL REUS W/TWL LRG LVL3 (GOWN DISPOSABLE) ×4
GOWN STRL REUS W/TWL XL LVL3 (GOWN DISPOSABLE) ×2
IMPLANT BREAST GEL MCP 130CC (Breast) ×1 IMPLANT
IV NS 1000ML (IV SOLUTION)
IV NS 1000ML BAXH (IV SOLUTION) IMPLANT
LIQUID BAND (GAUZE/BANDAGES/DRESSINGS) ×1 IMPLANT
NDL HYPO 25X1 1.5 SAFETY (NEEDLE) ×1 IMPLANT
NDL SAFETY ECLIPSE 18X1.5 (NEEDLE) ×1 IMPLANT
NEEDLE HYPO 18GX1.5 SHARP (NEEDLE) ×2
NEEDLE HYPO 25X1 1.5 SAFETY (NEEDLE) ×2 IMPLANT
PACK BASIN DAY SURGERY FS (CUSTOM PROCEDURE TRAY) ×2 IMPLANT
PENCIL BUTTON HOLSTER BLD 10FT (ELECTRODE) ×2 IMPLANT
PIN SAFETY STERILE (MISCELLANEOUS) IMPLANT
SIZER BREAST GENERIC (SIZER) ×1 IMPLANT
SIZER BREAST REUSE 130CC (SIZER) ×2
SIZER BRST REUSE 130CC (SIZER) IMPLANT
SLEEVE SCD COMPRESS KNEE MED (MISCELLANEOUS) ×2 IMPLANT
SPONGE GAUZE 4X4 12PLY STER LF (GAUZE/BANDAGES/DRESSINGS) IMPLANT
SPONGE LAP 18X18 X RAY DECT (DISPOSABLE) ×4 IMPLANT
SUT MNCRL 6-0 UNDY P1 1X18 (SUTURE) IMPLANT
SUT MNCRL AB 3-0 PS2 18 (SUTURE) IMPLANT
SUT MNCRL AB 4-0 PS2 18 (SUTURE) ×2 IMPLANT
SUT MON AB 3-0 SH 27 (SUTURE) ×2
SUT MON AB 3-0 SH27 (SUTURE) ×1 IMPLANT
SUT MON AB 5-0 PS2 18 (SUTURE) IMPLANT
SUT MONOCRYL 6-0 P1 1X18 (SUTURE) ×1
SUT PDS 3-0 CT2 (SUTURE) ×2
SUT PDS AB 2-0 CT2 27 (SUTURE) IMPLANT
SUT PDS II 3-0 CT2 27 ABS (SUTURE) IMPLANT
SUT SILK 3 0 PS 1 (SUTURE) IMPLANT
SUT VIC AB 3-0 SH 27 (SUTURE) ×2
SUT VIC AB 3-0 SH 27X BRD (SUTURE) ×1 IMPLANT
SUT VICRYL 4-0 PS2 18IN ABS (SUTURE) ×2 IMPLANT
SYR BULB IRRIGATION 50ML (SYRINGE) ×2 IMPLANT
SYR CONTROL 10ML LL (SYRINGE) ×1 IMPLANT
TOWEL OR 17X24 6PK STRL BLUE (TOWEL DISPOSABLE) ×4 IMPLANT
TUBE CONNECTING 20X1/4 (TUBING) ×2 IMPLANT
UNDERPAD 30X30 (UNDERPADS AND DIAPERS) ×4 IMPLANT
YANKAUER SUCT BULB TIP NO VENT (SUCTIONS) ×2 IMPLANT

## 2015-06-23 NOTE — Anesthesia Procedure Notes (Signed)
Procedure Name: LMA Insertion Date/Time: 06/23/2015 11:04 AM Performed by: Maryella Shivers Pre-anesthesia Checklist: Patient identified, Emergency Drugs available, Suction available and Patient being monitored Patient Re-evaluated:Patient Re-evaluated prior to inductionOxygen Delivery Method: Circle System Utilized Intubation Type: Inhalational induction Ventilation: Mask ventilation without difficulty and Oral airway inserted - appropriate to patient size LMA: LMA inserted LMA Size: 4.0 Number of attempts: 1 Placement Confirmation: positive ETCO2 Tube secured with: Tape Dental Injury: Teeth and Oropharynx as per pre-operative assessment

## 2015-06-23 NOTE — Anesthesia Postprocedure Evaluation (Signed)
Anesthesia Post Note  Patient: Carolyn Berg  Procedure(s) Performed: Procedure(s) (LRB): REMOVAL OF RIGHT BREAST TISSUE EXPANDER AND PLACEMENT OF SILICONE IMPLANT (Right)  Patient location during evaluation: PACU Anesthesia Type: General Level of consciousness: awake and alert Pain management: pain level controlled Vital Signs Assessment: post-procedure vital signs reviewed and stable Respiratory status: spontaneous breathing Cardiovascular status: blood pressure returned to baseline Anesthetic complications: no    Last Vitals:  Filed Vitals:   06/23/15 1330 06/23/15 1340  BP: 111/73   Pulse: 107 105  Temp:    Resp: 13 15    Last Pain:  Filed Vitals:   06/23/15 1343  PainSc: 3                  Tiajuana Amass

## 2015-06-23 NOTE — Discharge Instructions (Addendum)
Has meds at home. May shower tomorrow Continue binder or sports bra at all times No heavy lifting Up walking   Post Anesthesia Home Care Instructions  Activity: Get plenty of rest for the remainder of the day. A responsible adult should stay with you for 24 hours following the procedure.  For the next 24 hours, DO NOT: -Drive a car -Paediatric nurse -Drink alcoholic beverages -Take any medication unless instructed by your physician -Make any legal decisions or sign important papers.  Meals: Start with liquid foods such as gelatin or soup. Progress to regular foods as tolerated. Avoid greasy, spicy, heavy foods. If nausea and/or vomiting occur, drink only clear liquids until the nausea and/or vomiting subsides. Call your physician if vomiting continues.  Special Instructions/Symptoms: Your throat may feel dry or sore from the anesthesia or the breathing tube placed in your throat during surgery. If this causes discomfort, gargle with warm salt water. The discomfort should disappear within 24 hours.  If you had a scopolamine patch placed behind your ear for the management of post- operative nausea and/or vomiting:  1. The medication in the patch is effective for 72 hours, after which it should be removed.  Wrap patch in a tissue and discard in the trash. Wash hands thoroughly with soap and water. 2. You may remove the patch earlier than 72 hours if you experience unpleasant side effects which may include dry mouth, dizziness or visual disturbances. 3. Avoid touching the patch. Wash your hands with soap and water after contact with the patch.

## 2015-06-23 NOTE — Transfer of Care (Signed)
Immediate Anesthesia Transfer of Care Note  Patient: Carolyn Berg  Procedure(s) Performed: Procedure(s): REMOVAL OF RIGHT BREAST TISSUE EXPANDER AND PLACEMENT OF SILICONE IMPLANT (Right)  Patient Location: PACU  Anesthesia Type:General  Level of Consciousness: awake, alert  and oriented  Airway & Oxygen Therapy: Patient Spontanous Breathing and Patient connected to face mask oxygen  Post-op Assessment: Report given to RN and Post -op Vital signs reviewed and stable  Post vital signs: Reviewed and stable  Last Vitals:  Filed Vitals:   06/23/15 1017  BP: 111/68  Pulse: 105  Temp: 36.8 C  Resp: 18    Complications: No apparent anesthesia complications

## 2015-06-23 NOTE — Interval H&P Note (Signed)
History and Physical Interval Note:  06/23/2015 10:30 AM  Carolyn Berg  has presented today for surgery, with the diagnosis of RIGHT BREAST CANCER   The various methods of treatment have been discussed with the patient and family. After consideration of risks, benefits and other options for treatment, the patient has consented to  Procedure(s): REMOVAL OF RIGHT BREAST TISSUE EXPANDER AND PLACEMENT OF SILICONE IMPLANT (Right) as a surgical intervention .  The patient's history has been reviewed, patient examined, no change in status, stable for surgery.  I have reviewed the patient's chart and labs.  Questions were answered to the patient's satisfaction.     Wallace Going

## 2015-06-23 NOTE — Op Note (Addendum)
Op report Unilateral Breast Exchange   DATE OF OPERATION:  06/23/2015  LOCATION: [place]  SURGICAL DIVISION: Plastic Surgery  PREOPERATIVE DIAGNOSES:  1. History of right breast cancer.  2. Acquired absence of right breast.   POSTOPERATIVE DIAGNOSES:  1. History of right breast cancer.  2. Acquired absence of right breast.   PROCEDURE:  1. Exchange of tissue expander for implant. 2. Capsulotomies for implant respositioning.  SURGEON: Claire Sanger Dillingham, DO  ANESTHESIA:  General.   COMPLICATIONS: None.   IMPLANTS: [place]or Smooth Round Moderate Classic Profile Gel 130cc. Ref #[id number].  Serial Number [id number]  INDICATIONS FOR PROCEDURE:  The patient, Carolyn Berg, is a 50 y.o. female born on 12-12-1965, is here for treatment for further treatment after a mastectomy and placement of a tissue expander. She now presents for exchange of her expander for an implant.  She requires capsulotomies to better position the implant. MRN: [id number]  CONSENT:  Informed consent was obtained directly from the patient. Risks, benefits and alternatives were fully discussed. Specific risks including but not limited to bleeding, infection, hematoma, seroma, scarring, pain, implant infection, implant extrusion, capsular contracture, asymmetry, wound healing problems, and need for further surgery were all discussed. The patient did have an ample opportunity to have her questions answered to her satisfaction.   DESCRIPTION OF PROCEDURE:  The patient was taken to the operating room. SCDs were placed and IV antibiotics were given. The patient's chest was prepped and draped in a sterile fashion. A time out was performed and the implants to be used were identified.  One percent Xylocaine with epinephrine was used to infiltrate the area.   The old mastectomy scar was opened and superior mastectomy and inferior mastectomy flaps were re-raised over the pectoralis major  muscle. The pectoralis was split to expose the tissue expander which was removed. Inspection of the pocket showed a normal healthy capsule and good integration of the biologic matrix.   Circumferential capsulotomies were performed to allow for breast pocket expansion.  Measurements were made to confirm adequate pocket size for the implant dimensions.  Hemostasis was ensured with electrocautery.  The pocket was irrigated with antibiotic solution.  New gloves were placed.  The lateral rim of the capsule was released and sutured more medially to aid in better positioning of the implant.  The implant was placed in the pocket and oriented appropriately. The pectoralis major muscle and capsule on the anterior surface were re-closed with a 3-0 running Monocryl suture. The remaining skin was closed with 4-0 Monocryl deep dermal and 6-0 Monocryl subcuticular stitches.  Dermabond was applied.  A breast binder and ABD was applied.  The patient was allowed to wake from anesthesia and taken to the recovery room in satisfactory condition.

## 2015-06-23 NOTE — Brief Op Note (Signed)
06/23/2015  11:03 AM  PATIENT:  Carolyn Berg  50 y.o. female  PRE-OPERATIVE DIAGNOSIS:  RIGHT BREAST CANCER   POST-OPERATIVE DIAGNOSIS:  * No post-op diagnosis entered *  PROCEDURE:  Procedure(s): REMOVAL OF RIGHT BREAST TISSUE EXPANDER AND PLACEMENT OF SILICONE IMPLANT (Right)  SURGEON:  Surgeon(s) and Role:    * Winna Golla S Raul Winterhalter, DO - Primary  ASSISTANTS: none   ANESTHESIA:   general  EBL:     BLOOD ADMINISTERED:none  DRAINS: none   LOCAL MEDICATIONS USED:  MARCAINE     SPECIMEN:  Source of Specimen:  right mastectomy scar and capsule  DISPOSITION OF SPECIMEN:  PATHOLOGY  COUNTS:  YES  TOURNIQUET:  * No tourniquets in log *  DICTATION: .Dragon Dictation  PLAN OF CARE: Discharge to home after PACU  PATIENT DISPOSITION:  PACU - hemodynamically stable.   Delay start of Pharmacological VTE agent (>24hrs) due to surgical blood loss or risk of bleeding: no

## 2015-06-23 NOTE — Anesthesia Preprocedure Evaluation (Signed)
Anesthesia Evaluation  Patient identified by MRN, date of birth, ID band Patient awake    Reviewed: Allergy & Precautions, NPO status , Patient's Chart, lab work & pertinent test results  Airway Mallampati: II   Neck ROM: full    Dental   Pulmonary neg pulmonary ROS,    breath sounds clear to auscultation       Cardiovascular negative cardio ROS   Rhythm:regular Rate:Normal     Neuro/Psych Anxiety    GI/Hepatic   Endo/Other    Renal/GU      Musculoskeletal   Abdominal   Peds  Hematology   Anesthesia Other Findings   Reproductive/Obstetrics                             Anesthesia Physical Anesthesia Plan  ASA: II  Anesthesia Plan: General   Post-op Pain Management:    Induction: Intravenous  Airway Management Planned: LMA  Additional Equipment:   Intra-op Plan:   Post-operative Plan:   Informed Consent: I have reviewed the patients History and Physical, chart, labs and discussed the procedure including the risks, benefits and alternatives for the proposed anesthesia with the patient or authorized representative who has indicated his/her understanding and acceptance.     Plan Discussed with: CRNA, Anesthesiologist and Surgeon  Anesthesia Plan Comments:         Anesthesia Quick Evaluation

## 2015-06-24 ENCOUNTER — Encounter (HOSPITAL_BASED_OUTPATIENT_CLINIC_OR_DEPARTMENT_OTHER): Payer: Self-pay | Admitting: Plastic Surgery

## 2015-07-01 DIAGNOSIS — Z9889 Other specified postprocedural states: Secondary | ICD-10-CM | POA: Insufficient documentation

## 2015-07-15 ENCOUNTER — Other Ambulatory Visit: Payer: Self-pay | Admitting: Hematology

## 2015-08-26 ENCOUNTER — Encounter: Payer: Self-pay | Admitting: Hematology

## 2015-08-26 ENCOUNTER — Telehealth: Payer: Self-pay | Admitting: Hematology

## 2015-08-26 ENCOUNTER — Other Ambulatory Visit (HOSPITAL_BASED_OUTPATIENT_CLINIC_OR_DEPARTMENT_OTHER): Payer: BLUE CROSS/BLUE SHIELD

## 2015-08-26 ENCOUNTER — Ambulatory Visit (HOSPITAL_BASED_OUTPATIENT_CLINIC_OR_DEPARTMENT_OTHER): Payer: BLUE CROSS/BLUE SHIELD | Admitting: Hematology

## 2015-08-26 VITALS — BP 105/63 | HR 81 | Temp 98.2°F | Resp 17 | Ht 64.0 in | Wt 117.5 lb

## 2015-08-26 DIAGNOSIS — Z79810 Long term (current) use of selective estrogen receptor modulators (SERMs): Secondary | ICD-10-CM

## 2015-08-26 DIAGNOSIS — C50311 Malignant neoplasm of lower-inner quadrant of right female breast: Secondary | ICD-10-CM | POA: Diagnosis not present

## 2015-08-26 DIAGNOSIS — Z170 Estrogen receptor positive status [ER+]: Secondary | ICD-10-CM | POA: Diagnosis not present

## 2015-08-26 LAB — COMPREHENSIVE METABOLIC PANEL
ALBUMIN: 3.6 g/dL (ref 3.5–5.0)
ALT: 14 U/L (ref 0–55)
AST: 22 U/L (ref 5–34)
Alkaline Phosphatase: 45 U/L (ref 40–150)
Anion Gap: 5 mEq/L (ref 3–11)
BUN: 18.3 mg/dL (ref 7.0–26.0)
CALCIUM: 8.7 mg/dL (ref 8.4–10.4)
CHLORIDE: 108 meq/L (ref 98–109)
CO2: 27 mEq/L (ref 22–29)
Creatinine: 0.9 mg/dL (ref 0.6–1.1)
EGFR: 79 mL/min/{1.73_m2} — ABNORMAL LOW (ref >90)
Glucose: 89 mg/dl (ref 70–140)
POTASSIUM: 4.6 meq/L (ref 3.5–5.1)
Sodium: 140 mEq/L (ref 136–145)
TOTAL PROTEIN: 6.9 g/dL (ref 6.4–8.3)
Total Bilirubin: 0.39 mg/dL (ref 0.20–1.20)

## 2015-08-26 LAB — CBC WITH DIFFERENTIAL/PLATELET
BASO%: 0.5 % (ref 0.0–2.0)
Basophils Absolute: 0 10*3/uL (ref 0.0–0.1)
EOS ABS: 0 10*3/uL (ref 0.0–0.5)
EOS%: 1.1 % (ref 0.0–7.0)
HEMATOCRIT: 36.5 % (ref 34.8–46.6)
HEMOGLOBIN: 12 g/dL (ref 11.6–15.9)
LYMPH#: 1.3 10*3/uL (ref 0.9–3.3)
LYMPH%: 35.6 % (ref 14.0–49.7)
MCH: 30.3 pg (ref 25.1–34.0)
MCHC: 32.8 g/dL (ref 31.5–36.0)
MCV: 92.4 fL (ref 79.5–101.0)
MONO#: 0.3 10*3/uL (ref 0.1–0.9)
MONO%: 8.2 % (ref 0.0–14.0)
NEUT%: 54.6 % (ref 38.4–76.8)
NEUTROS ABS: 2 10*3/uL (ref 1.5–6.5)
PLATELETS: 251 10*3/uL (ref 145–400)
RBC: 3.95 10*6/uL (ref 3.70–5.45)
RDW: 13.2 % (ref 11.2–14.5)
WBC: 3.7 10*3/uL — AB (ref 3.9–10.3)

## 2015-08-26 NOTE — Telephone Encounter (Signed)
Gave and printed appt shced and avs for pt for August

## 2015-08-26 NOTE — Progress Notes (Signed)
[place]  Telephone:([phone] Fax:([phone]  Clinic follow up Note   Patient Care Team: [facility] as PCP - General Alphonsa Overall, MD as Consulting Physician (General Surgery) Truitt Merle, MD as Consulting Physician (Hematology) Thea Silversmith, MD as Consulting Physician (Radiation Oncology) Mauro Kaufmann, RN as Registered Nurse Rockwell Germany, RN as Registered Nurse Sylvan Cheese, NP as Nurse Practitioner (Nurse Practitioner) Wallace Going, DO as Consulting Physician (Plastic Surgery) Megan Salon, MD as Consulting Physician (Gynecology) 08/26/2015  CHIEF COMPLAINTS: Follow up right breast cancer   Oncology History   Breast cancer of lower-inner quadrant of right female breast [facility])   Staging form: Breast, AJCC 7th Edition     Clinical stage from 02/12/2015: Stage 0 (Tis (DCIS), N0, M0) - Unsigned       Staging comments: Staged at breast conference on 9.21.16      Pathologic stage from 03/13/2015: Stage IA (T1a, N0, cM0) - Signed by Truitt Merle, MD on 03/26/2015        Breast cancer of lower-inner quadrant of right female breast ([place])   01/28/2015 Mammogram Screening mammogram showed new regional heterogeneous calcifications in the right breast central to the nipple midline depth.    02/06/2015 Initial Biopsy Right breast core needle biopsy showed DCIS with calcifications and necrosis.   02/07/2015 Breast MRI Breast MRI showed non-mass enhancement involves majority of the lower, outer quadrant of the right breast spanning at least 3.4 x 5.0 x 5.2 cm. No other lesion or adenopathy.   02/07/2015 Clinical Stage Stage 0: Tis N0   02/12/2015 Procedure Breast/Ovarian Next panel reveals no clinically significant variant at ATM, BARD1, BRCA1, BRCA2, BRIP1, CDH1, CHEK2, EPCAM, FANCC, MLH1, MSH2, MSH6, NBN, PALB2, PMS2, PTEN, RAD51C, RAD51D, TP53, and XRCC2   03/13/2015 Definitive Surgery Right mastectomy/SLNB: invasive ductal  carcinoma, 0.4cm, G2, ER/PR+, HER2neu neg, Ki67 10%, extensive DCIS, G2. margins were negative for invasive carcinoma, but DCIS was involved in anterior margin, and broadly <0.1cm on other margins - none to cut   03/13/2015 Pathologic Stage Stage IA: T1a N0   04/24/2015 -  Anti-estrogen oral therapy Tamoxifen 38m daily. Potential change to AI once post menopausal. Planned duration of therapy 10 years.   06/05/2015 Survivorship Survivorship visit completed and copy of care plan given to patient.    HISTORY OF PRESENTING ILLNESS (02/11/2015):  Carolyn Berg 50y.o. female is here because of newly diagnosed right breast cancer. She presents to our multidisciplinary breast clinic to discuss further management. She is accompanied by her husband.  This was discovered by screening mammogram. She is very compliant with year screening mammogram. Her screening mammogram on 01/28/2015 showed a new regional heterogeneous calcification in the lower-outer quadrant of right breast. She underwent core needle biopsy on 02/07/2015, which showed DCIS.  She feels well overall, denies any symptoms, no pain, dyspnea, weight loss or night sweats. She is a housewife, very physically active, no significant past medical history, no family history of breast cancer. She is premenopausal, takes birth control pill.  CURRENT THERAPY: Tamoxifen 277mdaily, started on 03/28/2015   INTERIM HISTORY Ms. DeDuplessiseturns for follow-up. She is doing well overall. Her hot flush has subsided, only happens once a while, very tolerable. She tolerates tamoxifen very well, no other complains.   MEDICAL HISTORY:  Past Medical History  Diagnosis Date  . Fibroids   . Anxiety     anticipation of mastectomy   . Breast cancer of  lower-inner quadrant of right female breast ([place]) 02/07/2015  . Basal cell carcinoma of right thigh 05/2013    SURGICAL HISTORY: Past Surgical History  Procedure Laterality Date  . Finger fracture surgery Left      "middle finger"  . Skin biopsy Right 05/2013    basal cell on thigh  . Vaginal delivery      /w epidural   . Wisdom tooth extraction  early 2000's    /w IV sedation   . Breast reconstruction with placement of tissue expander and flex hd (acellular hydrated dermis) Right 03/13/2015  . Mastectomy complete / simple w/ sentinel node biopsy Right 03/13/2015  . Breast surgery Right 01/2015  . Partial mastectomy with axillary sentinel lymph node biopsy Right 03/13/2015    Procedure: RIGHT MASTECTOMY WITH AXILLARY SENTINEL LYMPH NODE BIOPSY;  Surgeon: Alphonsa Overall, MD;  Location: [place];  Service: General;  Laterality: Right;  . Breast reconstruction with placement of tissue expander and flex hd (acellular hydrated dermis) Right 03/13/2015    Procedure: IMMEDIATE RIGHT BREAST RECONSTRUCTION WITH PLACEMENT OF TISSUE EXPANDER AND FLEX PLIABLE 4X16 ;  Surgeon: Loel Lofty Dillingham, DO;  Location: [place];  Service: Plastics;  Laterality: Right;  . Removal of tissue expander and placement of implant Right 06/23/2015    Procedure: REMOVAL OF RIGHT BREAST TISSUE EXPANDER AND PLACEMENT OF SILICONE IMPLANT;  Surgeon: Wallace Going, DO;  Location: [place];  Service: Plastics;  Laterality: Right;   GYN HISTORY  Menarchal:13 LMP: 08/26/2015 Contraceptive: 20+ years  HRT: N/A  G1P1: daughter is 92, no breast feeding, no plan for more children    SOCIAL HISTORY: Social History   Social History  . Marital Status: Married    Spouse Name: N/A  . Number of Children: 1 daughter 81 yo   . Years of Education: N/A   Occupational History  . Not on file.   Social History Main Topics  . Smoking status: Never Smoker   . Smokeless tobacco: Never Used  . Alcohol Use: Yes     Comment: occ glass of wine  . Drug Use: No  . Sexual Activity:    Partners: Male    Birth Control/ Protection: Pill   Other Topics Concern  . Not on file   Social History Narrative    FAMILY HISTORY: Family  History  Problem Relation Age of Onset  . Diabetes Paternal Grandfather   . Heart attack Paternal Grandfather   . Colon cancer Maternal Grandfather 15    lived to 27  . Renal Disease Maternal Grandfather   . Breast cancer Other     MGF's mother dx over 26  . Lung cancer Maternal Uncle   . Stroke Maternal Grandmother     ALLERGIES:  has No Known Allergies.  MEDICATIONS:  Current Outpatient Prescriptions  Medication Sig Dispense Refill  . tamoxifen (NOLVADEX) 20 MG tablet TAKE 1 TABLET DAILY 90 tablet 3   No current facility-administered medications for this visit.    REVIEW OF SYSTEMS:   Constitutional: Denies fevers, chills or abnormal night sweats Eyes: Denies blurriness of vision, double vision or watery eyes Ears, nose, mouth, throat, and face: Denies mucositis or sore throat Respiratory: Denies cough, dyspnea or wheezes Cardiovascular: Denies palpitation, chest discomfort or lower extremity swelling Gastrointestinal:  Denies nausea, heartburn or change in bowel habits Skin: Denies abnormal skin rashes Lymphatics: Denies new lymphadenopathy or easy bruising Neurological:Denies numbness, tingling or new weaknesses Behavioral/Psych: Mood is stable, no new changes  All other systems were reviewed with the patient and are negative.  PHYSICAL EXAMINATION: ECOG PERFORMANCE STATUS: 1  Filed Vitals:   08/26/15 1021  BP: 105/63  Pulse: 81  Temp: 98.2 F (36.8 C)  Resp: 17   Filed Weights   08/26/15 1021  Weight: 117 lb 8 oz (53.298 kg)    GENERAL:alert, no distress and comfortable SKIN: skin color, texture, turgor are normal, no rashes or significant lesions EYES: normal, conjunctiva are pink and non-injected, sclera clear OROPHARYNX:no exudate, no erythema and lips, buccal mucosa, and tongue normal  NECK: supple, thyroid normal size, non-tender, without nodularity LYMPH:  no palpable lymphadenopathy in the cervical, axillary or inguinal LUNGS: clear to  auscultation and percussion with normal breathing effort HEART: regular rate & rhythm and no murmurs and no lower extremity edema ABDOMEN:abdomen soft, non-tender and normal bowel sounds Musculoskeletal:no cyanosis of digits and no clubbing  PSYCH: alert & oriented x 3 with fluent speech NEURO: no focal motor/sensory deficits Breasts: s/p right maostectomy and implant placement, old surgical scar clean, no skin erythema. Palpation of left breast and axilla revealed no obvious mass that I could appreciate.   LABORATORY DATA:  I have reviewed the data as listed CBC Latest Ref Rng 08/26/2015 05/27/2015 03/13/2015  WBC 3.9 - 10.3 10e3/uL 3.7(L) 5.0 11.4(H)  Hemoglobin 11.6 - 15.9 g/dL 12.0 11.8 11.0(L)  Hematocrit 34.8 - 46.6 % 36.5 35.4 33.1(L)  Platelets 145 - 400 10e3/uL 251 291 292    CMP Latest Ref Rng 08/26/2015 05/27/2015 03/13/2015  Glucose 70 - 140 mg/dl 89 93 164(H)  BUN 7.0 - 26.0 mg/dL 18.3 16.5 7  Creatinine 0.6 - 1.1 mg/dL 0.9 0.9 0.97  Sodium 136 - 145 mEq/L 140 142 134(L)  Potassium 3.5 - 5.1 mEq/L 4.6 4.2 4.5  Chloride 101 - 111 mmol/L - - 102  CO2 22 - 29 mEq/L 27 28 24   Calcium 8.4 - 10.4 mg/dL 8.7 9.0 8.6(L)  Total Protein 6.4 - 8.3 g/dL 6.9 7.1 -  Total Bilirubin 0.20 - 1.20 mg/dL 0.39 0.39 -  Alkaline Phos 40 - 150 U/L 45 50 -  AST 5 - 34 U/L 22 19 -  ALT 0 - 55 U/L 14 13 -      PATHOLOGY REPORT  Diagnosis 03/13/2015 1. Lymph node, sentinel, biopsy, right axillary - ONE BENIGN LYMPH NODE WITH NO TUMOR SEEN (0/1). 2. Breast, simple mastectomy, right - INVASIVE GRADE II DUCTAL CARCINOMA, SPANNING 0.4 CM IN GREATEST DIMENSION. - EXTENSIVE INTERMEDIATE GRADE DUCTAL CARCINOMA IN SITU WITH COMEDONECROSIS AND CALCIFICATIONS. - DUCTAL CARCINOMA IN SITU FOCALLY INVOLVES THE ANTERIOR MARGIN AND BROADLY IS LESS THAN 0.1 CM AWAY FROM THE ANTERIOR MARGIN. - DUCTAL CARCINOMA IN SITU IS BROADLY LESS THAN 0.1 CM AWAY FROM THE POSTERIOR MARGIN AND THE MARGIN CANNOT BE CLEARED  OF FOCAL INVOLVEMENT WITH DUCTAL CARCINOMA IN SITU. - OTHER MARGINS ARE NEGATIVE. - SEE ONCOLOGY TEMPLATE AND COMMENT. 3. Skin , right breast skin flaps - BENIGN SKIN WITH UNDERLYING BENIGN BREAST TISSUE. - NO DYSPLASIA, ATYPIA OR TUMOR SEEN.  Microscopic Comment 2. BREAST, INVASIVE TUMOR, WITH LYMPH NODES PRESENT Specimen, including laterality and lymph node sampling (sentinel, non-sentinel): Right breast with right sentinel lymph node and skin flaps. Procedure: Right mastectomy with axillary sentinel lymph node biopsy. Histologic type: Invasive ductal carcinoma. Grade: 2. Tubule formation: 3. Nuclear pleomorphism: 2. Mitotic: 1. Tumor size (glass slide measurement): 0.4 cm. Margins: Invasive, distance to closest margin: At least 0.3 cm (posterior margin). In-situ, distance to  closest margin: Ductal carcinoma in situ focally involves the anterior soft tissue margin; there is a focal area of the posterior margin which cannot be effectively cleared of involvement, see below comment. If margin positive, focally or broadly: Focally positive. Lymphovascular invasion: Not identified. Ductal carcinoma in situ: Yes. Grade: Intermediate grade. Extensive intraductal component: Yes. Lobular neoplasia: Not identified. Tumor focality: Invasive tumor is unifocal. Treatment effect: Not applicable. Extent of tumor: Tumor confined to breast parenchyma. Skin: Not involved. Nipple: Not involved. Skeletal muscle: Not involved. Lymph nodes: Examined: 1 Sentinel. 0 Non-sentinel. 1 Total. Lymph nodes with metastasis: 0. Isolated tumor cells (< 0.2 mm): 0. Micrometastasis: (> 0.2 mm and < 2.0 mm): 0. Macrometastasis: (> 2.0 mm): 0. Extracapsular extension: Not applicable. Breast prognostic profile: Will be performed on current invasive tumor. Estrogen receptor: Will be performed on current invasive tumor. Progesterone receptor: Will be performed on current invasive tumor. Her 2 neu: Will be  performed on current invasive tumor. Ki-67: Will be performed on current invasive tumor. Additional breast findings: Fibrocystic changes with usual ductal hyperplasia. TNM: pT1a, pN0. Comments: There is a focal area of atypical cauterized glands where involvement with ductal carcinoma in situ of the posterior margin cannot be excluded (the margin cannot be effectively cleared). A breast prognostic profile will be performed on the invasive ductal carcinoma. This will be reported in an addendum to follow. (RH:ds 03/14/15)  Results: IMMUNOHISTOCHEMICAL AND MORPHOMETRIC ANALYSIS PERFORMED MANUALLY Estrogen Receptor: 90%, POSITIVE, STRONG STAINING INTENSITY Progesterone Receptor: 90%, POSITIVE, STRONG STAINING INTENSITY Proliferation Marker Ki67: 10%  Results: HER2 - NEGATIVE RATIO OF HER2/CEP17 SIGNALS 1.28 AVERAGE HER2 COPY NUMBER PER CELL 2.95  RADIOGRAPHIC STUDIES: I have personally reviewed the radiological images as listed and agreed with the findings in the report.  Mr Breast Bilateral W Wo Contrast 02/10/2015    FINDINGS: Breast composition: d. Extreme fibroglandular tissue.  Background parenchymal enhancement: Marked  Right breast: There is asymmetric non mass enhancement involving the majority of the lower, outer quadrant of the right breast when compared to the left. This non mass enhancement is difficult to precisely measure given the marked background enhancement. However, the non mass enhancement appears to span at least 3.4 (AP) x 5.0 (TR) x 5.2 (CC) cm. Biopsy marker artifact corresponding to the known site of DCIS is seen within the inferolateral aspect of this enhancement in the lower, outer right breast, series 5, image 115.  Left breast: No dominant mass or suspicious enhancement. There is marked background parenchymal enhancement.  Lymph nodes: No abnormal appearing lymph nodes.  Ancillary findings:  None.   IMPRESSION: Asymmetric, non mass enhancement involves the majority of  the lower, outer quadrant of the right breast spanning at least 3.4 x 5.0 x 5.2 cm. This non mass enhancement appears to correspond to the extent of calcifications seen mammographically. If breast conservation is being considered, additional stereotactic biopsy of the right breast may be warranted for further evaluation of extent of disease.  RECOMMENDATION: Treatment planning.  BI-RADS CATEGORY  6: Known biopsy-proven malignancy.   Electronically Signed   By: Pamelia Hoit M.D.   On: 02/10/2015 13:24    ASSESSMENT AND PLAN : 50 year old Caucasian female with screening detected right breast cancer  1. 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  --Given her strong ER/PR positivity, I strongly recommend her consider adjuvant endocrine therapy. She is  premenopausal, I recommend tamoxifen for total of 10 years, or switch tamoxifen to aromatase inhibitor for additional 5 years when she becomes postmenopausal down the road. -she has been tolerating tamoxifen well, no hot flash, or other side effects, we'll continue. -continue breast cancer surveillance with annual mammogram, and also consider breast MI due to her dense breast tissue (category C) if her insurance coverage. -she is due for mammogram in 01/2016    2. Genetics -Her genetic testing was negative for inheritable breast/ovarian syndrome.  Plan: I'll see her back in 4 months for follow up with lab. Will order mammogram on next visit.   All questions were answered. The patient knows to call the clinic with any problems, questions or concerns. I spent 15 minutes counseling the patient face to face. The total time spent in the appointment was 20 minutes and more than 50% was on counseling.       Truitt Merle, MD 08/26/2015

## 2015-11-14 ENCOUNTER — Encounter: Payer: Self-pay | Admitting: Obstetrics & Gynecology

## 2015-11-14 ENCOUNTER — Ambulatory Visit (INDEPENDENT_AMBULATORY_CARE_PROVIDER_SITE_OTHER): Payer: BLUE CROSS/BLUE SHIELD | Admitting: Obstetrics & Gynecology

## 2015-11-14 VITALS — BP 98/52 | HR 86 | Resp 16 | Ht 64.5 in | Wt 118.8 lb

## 2015-11-14 DIAGNOSIS — Z1211 Encounter for screening for malignant neoplasm of colon: Secondary | ICD-10-CM | POA: Diagnosis not present

## 2015-11-14 DIAGNOSIS — N912 Amenorrhea, unspecified: Secondary | ICD-10-CM | POA: Diagnosis not present

## 2015-11-14 DIAGNOSIS — Z01419 Encounter for gynecological examination (general) (routine) without abnormal findings: Secondary | ICD-10-CM

## 2015-11-14 DIAGNOSIS — Z124 Encounter for screening for malignant neoplasm of cervix: Secondary | ICD-10-CM

## 2015-11-14 LAB — FOLLICLE STIMULATING HORMONE: FSH: 26.1 m[IU]/mL

## 2015-11-14 NOTE — Progress Notes (Signed)
50 y.o. G1P1 MarriedCaucasianF here for annual exam.  Doing well.  Pt did stop her OCP last year after DCIS diagnosis.  Had extensive DCIS and has mastectomy, tissue expander, and then implant.  Pt has cycled very little since stopping OCPs.  LMP was 2/17 and this was light and lasted two days.  On Tamoxifen.    Did have negative genetic testing.   Patient's last menstrual period was 07/09/2015.          Sexually active: Yes.    The current method of family planning is post menopausal status.    Exercising: Yes.    Weights, Cardio, Pilates, Yoga Smoker:  no  Health Maintenance: Pap:  08/31/2013-WNL History of abnormal Pap:  yes MMG:  01/28/15-BIRADS0; 02/10/15-MR Bilateral BIRADS6; 02/2015-R Breast Mastectomy & Reconstruction. Scheduled Dx of L breast 01/2016 Colonoscopy:  Never BMD:   Never TDaP: 2010 Pneumonia vaccine(s):  never Zostavax:   Never Hep C testing: Never Screening Labs: PCP, Hb today: PCP, Urine today: PCP   reports that she has never smoked. She has never used smokeless tobacco. She reports that she drinks alcohol. She reports that she does not use illicit drugs.  Past Medical History  Diagnosis Date  . Fibroids   . Anxiety     anticipation of mastectomy   . Breast cancer of lower-inner quadrant of right female breast ([place]) 02/07/2015  . Basal cell carcinoma of right thigh 05/2013    Past Surgical History  Procedure Laterality Date  . Finger fracture surgery Left     "middle finger"  . Skin biopsy Right 05/2013    basal cell on thigh  . Vaginal delivery      /w epidural   . Wisdom tooth extraction  early 2000's    /w IV sedation   . Breast reconstruction with placement of tissue expander and flex hd (acellular hydrated dermis) Right 03/13/2015  . Mastectomy complete / simple w/ sentinel node biopsy Right 03/13/2015  . Breast surgery Right 01/2015  . Partial mastectomy with axillary sentinel lymph node biopsy Right 03/13/2015    Procedure: RIGHT MASTECTOMY WITH  AXILLARY SENTINEL LYMPH NODE BIOPSY;  Surgeon: Alphonsa Overall, MD;  Location: [facility];  Service: General;  Laterality: Right;  . Breast reconstruction with placement of tissue expander and flex hd (acellular hydrated dermis) Right 03/13/2015    Procedure: IMMEDIATE RIGHT BREAST RECONSTRUCTION WITH PLACEMENT OF TISSUE EXPANDER AND FLEX PLIABLE 4X16 ;  Surgeon: Loel Lofty Dillingham, DO;  Location: [facility];  Service: Plastics;  Laterality: Right;  . Removal of tissue expander and placement of implant Right 06/23/2015    Procedure: REMOVAL OF RIGHT BREAST TISSUE EXPANDER AND PLACEMENT OF SILICONE IMPLANT;  Surgeon: Wallace Going, DO;  Location: [facility];  Service: Plastics;  Laterality: Right;    Current Outpatient Prescriptions  Medication Sig Dispense Refill  . JUBLIA 10 % SOLN USE QD UTD  1  . tamoxifen (NOLVADEX) 20 MG tablet TAKE 1 TABLET DAILY 90 tablet 3   No current facility-administered medications for this visit.    Family History  Problem Relation Age of Onset  . Diabetes Paternal Grandfather   . Heart attack Paternal Grandfather   . Colon cancer Maternal Grandfather 29    lived to 65  . Renal Disease Maternal Grandfather   . Breast cancer Other     MGF's mother dx over 43  . Lung cancer Maternal Uncle   . Stroke Maternal Grandmother     ROS:  Pertinent items are noted in HPI.  Otherwise, a comprehensive ROS was negative.  Exam:   BP 98/52 mmHg  Pulse 86  Resp 16  Ht 5' 4.5" (1.638 m)  Wt 118 lb 12.8 oz (53.887 kg)  BMI 20.08 kg/m2  LMP 07/09/2015  Weight change: -7#  Height: 5' 4.5" (163.8 cm)  Ht Readings from Last 3 Encounters:  11/14/15 5' 4.5" (1.638 m)  08/26/15 _0  (1.626 m)  06/23/15 _1  (1.626 m)   General appearance: alert, cooperative and appears stated age Head: Normocephalic, without obvious abnormality, atraumatic Neck: no adenopathy, supple, symmetrical, trachea midline and thyroid normal to inspection and palpation Lungs: clear  to auscultation bilaterally Breasts: normal appearance, no masses or tenderness on the left, s/p  mastectomy and implant on the left with well healed scars.  No LAD. Heart: regular rate and rhythm Abdomen: soft, non-tender; bowel sounds normal; no masses,  no organomegaly Extremities: extremities normal, atraumatic, no cyanosis or edema Skin: Skin color, texture, turgor normal. No rashes or lesions Lymph nodes: Cervical, supraclavicular, and axillary nodes normal. No abnormal inguinal nodes palpated Neurologic: Grossly normal   Pelvic: External genitalia:  no lesions              Urethra:  normal appearing urethra with no masses, tenderness or lesions              Bartholins and Skenes: normal                 Vagina: normal appearing vagina with normal color and discharge, no lesions              Cervix: no lesions              Pap taken: Yes.   Bimanual Exam:  Uterus:  normal size, contour, position, consistency, mobility, non-tender              Adnexa: normal adnexa and no mass, fullness, tenderness               Rectovaginal: Confirms               Anus:  normal sphincter tone, no lesions  Chaperone was present for exam.  A:  Well Woman with normal exam Fibroid uterus Grandfather with colon cancer Grade 4 breast density H/O extensive DCIS 2016, s/p simple mastectomy with tissue expander and now implant.  No chemo or radiation.  On Tamoxifen.  Negative genetic testing.  P: Unilateral MMG due in September.  Will have MRI this year as well. pap smear only obtained 2015. Pap and HR HPV today.  Pt desires yearly while on Tamoxifen.   D/w pt risks of endometrial cancer with Tamoxifen.  Pt knows to call with any irregular bleeding or concerns Will plan colonoscopy this year as she has met deductible.  Order placed.   [place] pending.  Will let pt know if she needs to continue using condoms.  Does not want anything else for contraception. No other lab work today Return annually  or prn

## 2015-11-18 LAB — IPS PAP TEST WITH HPV

## 2015-12-30 ENCOUNTER — Telehealth: Payer: Self-pay | Admitting: Hematology

## 2015-12-30 ENCOUNTER — Ambulatory Visit (HOSPITAL_BASED_OUTPATIENT_CLINIC_OR_DEPARTMENT_OTHER): Payer: BLUE CROSS/BLUE SHIELD | Admitting: Hematology

## 2015-12-30 ENCOUNTER — Other Ambulatory Visit (HOSPITAL_BASED_OUTPATIENT_CLINIC_OR_DEPARTMENT_OTHER): Payer: BLUE CROSS/BLUE SHIELD

## 2015-12-30 ENCOUNTER — Encounter: Payer: Self-pay | Admitting: Hematology

## 2015-12-30 VITALS — BP 103/58 | HR 75 | Temp 98.0°F | Resp 17 | Ht 64.5 in | Wt 120.4 lb

## 2015-12-30 DIAGNOSIS — Z170 Estrogen receptor positive status [ER+]: Secondary | ICD-10-CM

## 2015-12-30 DIAGNOSIS — Z79810 Long term (current) use of selective estrogen receptor modulators (SERMs): Secondary | ICD-10-CM | POA: Diagnosis not present

## 2015-12-30 DIAGNOSIS — C50311 Malignant neoplasm of lower-inner quadrant of right female breast: Secondary | ICD-10-CM | POA: Diagnosis not present

## 2015-12-30 LAB — COMPREHENSIVE METABOLIC PANEL
ALT: 20 U/L (ref 0–55)
ANION GAP: 7 meq/L (ref 3–11)
AST: 22 U/L (ref 5–34)
Albumin: 3.7 g/dL (ref 3.5–5.0)
Alkaline Phosphatase: 49 U/L (ref 40–150)
BUN: 17.6 mg/dL (ref 7.0–26.0)
CHLORIDE: 105 meq/L (ref 98–109)
CO2: 30 meq/L — AB (ref 22–29)
Calcium: 9.2 mg/dL (ref 8.4–10.4)
Creatinine: 0.9 mg/dL (ref 0.6–1.1)
EGFR: 72 mL/min/{1.73_m2} — AB (ref >90)
Glucose: 83 mg/dl (ref 70–140)
POTASSIUM: 4 meq/L (ref 3.5–5.1)
Sodium: 142 mEq/L (ref 136–145)
Total Bilirubin: 0.39 mg/dL (ref 0.20–1.20)
Total Protein: 7.2 g/dL (ref 6.4–8.3)

## 2015-12-30 LAB — CBC WITH DIFFERENTIAL/PLATELET
BASO%: 0.4 % (ref 0.0–2.0)
BASOS ABS: 0 10*3/uL (ref 0.0–0.1)
EOS%: 1.3 % (ref 0.0–7.0)
Eosinophils Absolute: 0.1 10*3/uL (ref 0.0–0.5)
HCT: 35.3 % (ref 34.8–46.6)
HGB: 11.8 g/dL (ref 11.6–15.9)
LYMPH%: 41.8 % (ref 14.0–49.7)
MCH: 30.3 pg (ref 25.1–34.0)
MCHC: 33.3 g/dL (ref 31.5–36.0)
MCV: 91.1 fL (ref 79.5–101.0)
MONO#: 0.4 10*3/uL (ref 0.1–0.9)
MONO%: 7.9 % (ref 0.0–14.0)
NEUT#: 2.4 10*3/uL (ref 1.5–6.5)
NEUT%: 48.6 % (ref 38.4–76.8)
PLATELETS: 273 10*3/uL (ref 145–400)
RBC: 3.88 10*6/uL (ref 3.70–5.45)
RDW: 13.1 % (ref 11.2–14.5)
WBC: 4.9 10*3/uL (ref 3.9–10.3)
lymph#: 2 10*3/uL (ref 0.9–3.3)

## 2015-12-30 NOTE — Telephone Encounter (Signed)
Gave pt cal & avs °

## 2015-12-30 NOTE — Progress Notes (Signed)
[facility]  [phone] [phone]  Clinic follow up Note   Patient Care Team: [facility] as PCP - General Alphonsa Overall, MD as Consulting Physician (General Surgery) Truitt Merle, MD as Consulting Physician (Hematology) Thea Silversmith, MD as Consulting Physician (Radiation Oncology) Mauro Kaufmann, RN as Registered Nurse Rockwell Germany, RN as Registered Nurse Sylvan Cheese, NP as Nurse Practitioner (Nurse Practitioner) Wallace Going, DO as Consulting Physician (Plastic Surgery) Megan Salon, MD as Consulting Physician (Gynecology) 12/30/2015  CHIEF COMPLAINTS: Follow up right breast cancer   Oncology History   Breast cancer of lower-inner quadrant of right female breast [facility])   Staging form: Breast, AJCC 7th Edition     Clinical stage from 02/12/2015: Stage 0 (Tis (DCIS), N0, M0) - Unsigned       Staging comments: Staged at breast conference on 9.21.16      Pathologic stage from 03/13/2015: Stage IA (T1a, N0, cM0) - Signed by Truitt Merle, MD on 03/26/2015        Breast cancer of lower-inner quadrant of right female breast ([place])   01/28/2015 Mammogram    Screening mammogram showed new regional heterogeneous calcifications in the right breast central to the nipple midline depth.      02/06/2015 Initial Biopsy    Right breast core needle biopsy showed DCIS with calcifications and necrosis.     02/07/2015 Breast MRI    Breast MRI showed non-mass enhancement involves majority of the lower, outer quadrant of the right breast spanning at least 3.4 x 5.0 x 5.2 cm. No other lesion or adenopathy.     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 margins were negative for invasive carcinoma, but DCIS was involved in anterior margin, and broadly <0.1cm on other margins - none to cut     03/13/2015 Pathologic Stage    Stage IA: T1a N0     04/24/2015 -  Anti-estrogen oral therapy    Tamoxifen 60m daily. Potential change to AI once post menopausal. Planned duration of therapy 10 years.     06/05/2015 Survivorship    Survivorship visit completed and copy of care plan given to patient.      HISTORY OF PRESENTING ILLNESS (02/11/2015):  Carolyn Berg 50y.o. female is here because of newly diagnosed right breast cancer. She presents to our multidisciplinary breast clinic to discuss further management. She is accompanied by her husband.  This was discovered by screening mammogram. She is very compliant with year screening mammogram. Her screening mammogram on 01/28/2015 showed a new regional heterogeneous calcification in the lower-outer quadrant of right breast. She underwent core needle biopsy on 02/07/2015, which showed DCIS.  She feels well overall, denies any symptoms, no pain, dyspnea, weight loss or night sweats. She is a housewife, very physically active, no significant past medical history, no family history of breast cancer. She is premenopausal, takes birth control pill.  CURRENT THERAPY: Tamoxifen 243mdaily, started on 03/28/2015   INTERIM HISTORY Ms. DeRumereturns for follow-up. She is doing well overall. She is very compliant with tamoxifen, and tolerates  well. She has mild hot flash, tolerable. No other significant side effects from tamoxifen. She has not had missed her period since February 2017. She was seen by her gynecologist Dr. Sabra Heck in June. She has good appetite and energy level, remains to be physically active, exercise regularly.  MEDICAL HISTORY:  Past Medical History:  Diagnosis Date  . Anxiety     anticipation of mastectomy   . Basal cell carcinoma of right thigh 05/2013  . Breast cancer of lower-inner quadrant of right female breast ([place]) 02/07/2015  . Fibroids     SURGICAL HISTORY: Past Surgical History:  Procedure Laterality Date  . BREAST RECONSTRUCTION WITH PLACEMENT OF TISSUE EXPANDER AND FLEX HD (ACELLULAR HYDRATED DERMIS) Right 03/13/2015  . BREAST RECONSTRUCTION WITH PLACEMENT OF TISSUE EXPANDER AND FLEX HD (ACELLULAR HYDRATED DERMIS) Right 03/13/2015   Procedure: IMMEDIATE RIGHT BREAST RECONSTRUCTION WITH PLACEMENT OF TISSUE EXPANDER AND FLEX PLIABLE 4X16 ;  Surgeon: Loel Lofty Dillingham, DO;  Location: [facility];  Service: Plastics;  Laterality: Right;  . BREAST SURGERY Right 01/2015  . FINGER FRACTURE SURGERY Left    "middle finger"  . MASTECTOMY COMPLETE / SIMPLE W/ SENTINEL NODE BIOPSY Right 03/13/2015  . PARTIAL MASTECTOMY WITH AXILLARY SENTINEL LYMPH NODE BIOPSY Right 03/13/2015   Procedure: RIGHT MASTECTOMY WITH AXILLARY SENTINEL LYMPH NODE BIOPSY;  Surgeon: Alphonsa Overall, MD;  Location: [facility];  Service: General;  Laterality: Right;  . REMOVAL OF TISSUE EXPANDER AND PLACEMENT OF IMPLANT Right 06/23/2015   Procedure: REMOVAL OF RIGHT BREAST TISSUE EXPANDER AND PLACEMENT OF SILICONE IMPLANT;  Surgeon: Wallace Going, DO;  Location: [facility];  Service: Plastics;  Laterality: Right;  . SKIN BIOPSY Right 05/2013   basal cell on thigh  . VAGINAL DELIVERY     /w epidural   . WISDOM TOOTH EXTRACTION  early 2000's   /w IV sedation    GYN HISTORY  Menarchal:13 LMP: 06/2015 Contraceptive: 20+ years  HRT: N/A  G1P1: daughter is 54, no breast feeding, no plan for more children    SOCIAL HISTORY: Social History   Social History  . Marital Status: Married    Spouse Name: N/A  . Number of Children: 1 daughter 20 yo   . Years of Education: N/A   Occupational History  . Not on file.   Social History Main Topics  . Smoking status: Never Smoker   .  Smokeless tobacco: Never Used  . Alcohol Use: Yes     Comment: occ glass of wine  . Drug Use: No  . Sexual Activity:    Partners: Male    Birth Control/ Protection: Pill   Other Topics Concern  . Not on file   Social History Narrative    FAMILY HISTORY: Family History  Problem Relation Age of Onset  . Diabetes Paternal Grandfather   . Heart attack Paternal Grandfather   . Colon cancer Maternal Grandfather 93    lived to 69  . Renal Disease Maternal Grandfather   . Breast cancer Other     MGF's mother dx over 74  . Lung cancer Maternal Uncle   . Stroke Maternal Grandmother     ALLERGIES:  has No Known Allergies.  MEDICATIONS:  Current Outpatient Prescriptions  Medication Sig Dispense Refill  . JUBLIA 10 % SOLN USE QD UTD  1  . tamoxifen (NOLVADEX) 20 MG tablet TAKE 1 TABLET DAILY 90 tablet 3   No current facility-administered medications for this visit.     REVIEW OF SYSTEMS:  Constitutional: Denies fevers, chills or abnormal night sweats Eyes: Denies blurriness of vision, double vision or watery eyes Ears, nose, mouth, throat, and face: Denies mucositis or sore throat Respiratory: Denies cough, dyspnea or wheezes Cardiovascular: Denies palpitation, chest discomfort or lower extremity swelling Gastrointestinal:  Denies nausea, heartburn or change in bowel habits Skin: Denies abnormal skin rashes Lymphatics: Denies new lymphadenopathy or easy bruising Neurological:Denies numbness, tingling or new weaknesses Behavioral/Psych: Mood is stable, no new changes  All other systems were reviewed with the patient and are negative.  PHYSICAL EXAMINATION: ECOG PERFORMANCE STATUS: 0  Vitals:   12/30/15 1505  BP: (!) 103/58  Pulse: 75  Resp: 17  Temp: 98 F (36.7 C)   Filed Weights   12/30/15 1505  Weight: 120 lb 6.4 oz (54.6 kg)    GENERAL:alert, no distress and comfortable SKIN: skin color, texture, turgor are normal, no rashes or significant lesions EYES:  normal, conjunctiva are pink and non-injected, sclera clear OROPHARYNX:no exudate, no erythema and lips, buccal mucosa, and tongue normal  NECK: supple, thyroid normal size, non-tender, without nodularity LYMPH:  no palpable lymphadenopathy in the cervical, axillary or inguinal LUNGS: clear to auscultation and percussion with normal breathing effort HEART: regular rate & rhythm and no murmurs and no lower extremity edema ABDOMEN:abdomen soft, non-tender and normal bowel sounds Musculoskeletal:no cyanosis of digits and no clubbing  PSYCH: alert & oriented x 3 with fluent speech NEURO: no focal motor/sensory deficits Breasts: s/p right maostectomy and implant placement, old surgical scar well healed, no skin erythema. Palpation of left breast and axilla revealed no obvious mass that I could appreciate.   LABORATORY DATA:  I have reviewed the data as listed CBC Latest Ref Rng & Units 12/30/2015 08/26/2015 05/27/2015  WBC 3.9 - 10.3 10e3/uL 4.9 3.7(L) 5.0  Hemoglobin 11.6 - 15.9 g/dL 11.8 12.0 11.8  Hematocrit 34.8 - 46.6 % 35.3 36.5 35.4  Platelets 145 - 400 10e3/uL 273 251 291    CMP Latest Ref Rng & Units 12/30/2015 08/26/2015 05/27/2015  Glucose 70 - 140 mg/dl 83 89 93  BUN 7.0 - 26.0 mg/dL 17.6 18.3 16.5  Creatinine 0.6 - 1.1 mg/dL 0.9 0.9 0.9  Sodium 136 - 145 mEq/L 142 140 142  Potassium 3.5 - 5.1 mEq/L 4.0 4.6 4.2  Chloride 101 - 111 mmol/L - - -  CO2 22 - 29 mEq/L 30(H) 27 28  Calcium 8.4 - 10.4 mg/dL 9.2 8.7 9.0  Total Protein 6.4 - 8.3 g/dL 7.2 6.9 7.1  Total Bilirubin 0.20 - 1.20 mg/dL 0.39 0.39 0.39  Alkaline Phos 40 - 150 U/L 49 45 50  AST 5 - 34 U/L _0 ALT 0 - 55 U/L _1 PATHOLOGY REPORT  Diagnosis 03/13/2015 1. Lymph node, sentinel, biopsy, right axillary - ONE BENIGN LYMPH NODE WITH NO TUMOR SEEN (0/1). 2. Breast, simple mastectomy, right - INVASIVE GRADE II DUCTAL CARCINOMA, SPANNING 0.4 CM IN GREATEST DIMENSION. - EXTENSIVE INTERMEDIATE GRADE DUCTAL  CARCINOMA IN SITU WITH COMEDONECROSIS AND CALCIFICATIONS. - DUCTAL CARCINOMA IN SITU FOCALLY INVOLVES THE ANTERIOR MARGIN AND BROADLY IS LESS THAN 0.1 CM AWAY FROM THE ANTERIOR MARGIN. - DUCTAL CARCINOMA IN SITU IS BROADLY LESS THAN 0.1 CM AWAY FROM THE POSTERIOR MARGIN AND THE MARGIN CANNOT BE CLEARED OF FOCAL INVOLVEMENT WITH DUCTAL CARCINOMA IN SITU. - OTHER MARGINS ARE NEGATIVE. - SEE ONCOLOGY TEMPLATE AND COMMENT. 3. Skin , right breast skin flaps - BENIGN SKIN WITH UNDERLYING BENIGN  BREAST TISSUE. - NO DYSPLASIA, ATYPIA OR TUMOR SEEN.  Microscopic Comment 2. BREAST, INVASIVE TUMOR, WITH LYMPH NODES PRESENT Specimen, including laterality and lymph node sampling (sentinel, non-sentinel): Right breast with right sentinel lymph node and skin flaps. Procedure: Right mastectomy with axillary sentinel lymph node biopsy. Histologic type: Invasive ductal carcinoma. Grade: 2. Tubule formation: 3. Nuclear pleomorphism: 2. Mitotic: 1. Tumor size (glass slide measurement): 0.4 cm. Margins: Invasive, distance to closest margin: At least 0.3 cm (posterior margin). In-situ, distance to closest margin: Ductal carcinoma in situ focally involves the anterior soft tissue margin; there is a focal area of the posterior margin which cannot be effectively cleared of involvement, see below comment. If margin positive, focally or broadly: Focally positive. Lymphovascular invasion: Not identified. Ductal carcinoma in situ: Yes. Grade: Intermediate grade. Extensive intraductal component: Yes. Lobular neoplasia: Not identified. Tumor focality: Invasive tumor is unifocal. Treatment effect: Not applicable. Extent of tumor: Tumor confined to breast parenchyma. Skin: Not involved. Nipple: Not involved. Skeletal muscle: Not involved. Lymph nodes: Examined: 1 Sentinel. 0 Non-sentinel. 1 Total. Lymph nodes with metastasis: 0. Isolated tumor cells (< 0.2 mm): 0. Micrometastasis: (> 0.2 mm and < 2.0 mm):  0. Macrometastasis: (> 2.0 mm): 0. Extracapsular extension: Not applicable. Breast prognostic profile: Will be performed on current invasive tumor. Estrogen receptor: Will be performed on current invasive tumor. Progesterone receptor: Will be performed on current invasive tumor. Her 2 neu: Will be performed on current invasive tumor. Ki-67: Will be performed on current invasive tumor. Additional breast findings: Fibrocystic changes with usual ductal hyperplasia. TNM: pT1a, pN0. Comments: There is a focal area of atypical cauterized glands where involvement with ductal carcinoma in situ of the posterior margin cannot be excluded (the margin cannot be effectively cleared). A breast prognostic profile will be performed on the invasive ductal carcinoma. This will be reported in an addendum to follow. (RH:ds 03/14/15)  Results: IMMUNOHISTOCHEMICAL AND MORPHOMETRIC ANALYSIS PERFORMED MANUALLY Estrogen Receptor: 90%, POSITIVE, STRONG STAINING INTENSITY Progesterone Receptor: 90%, POSITIVE, STRONG STAINING INTENSITY Proliferation Marker Ki67: 10%  Results: HER2 - NEGATIVE RATIO OF HER2/CEP17 SIGNALS 1.28 AVERAGE HER2 COPY NUMBER PER CELL 2.95  RADIOGRAPHIC STUDIES: I have personally reviewed the radiological images as listed and agreed with the findings in the report.  Mr Breast Bilateral W Wo Contrast 02/10/2015    FINDINGS: Breast composition: d. Extreme fibroglandular tissue.  Background parenchymal enhancement: Marked  Right breast: There is asymmetric non mass enhancement involving the majority of the lower, outer quadrant of the right breast when compared to the left. This non mass enhancement is difficult to precisely measure given the marked background enhancement. However, the non mass enhancement appears to span at least 3.4 (AP) x 5.0 (TR) x 5.2 (CC) cm. Biopsy marker artifact corresponding to the known site of DCIS is seen within the inferolateral aspect of this enhancement in the  lower, outer right breast, series 5, image 115.  Left breast: No dominant mass or suspicious enhancement. There is marked background parenchymal enhancement.  Lymph nodes: No abnormal appearing lymph nodes.  Ancillary findings:  None.   IMPRESSION: Asymmetric, non mass enhancement involves the majority of the lower, outer quadrant of the right breast spanning at least 3.4 x 5.0 x 5.2 cm. This non mass enhancement appears to correspond to the extent of calcifications seen mammographically. If breast conservation is being considered, additional stereotactic biopsy of the right breast may be warranted for further evaluation of extent of disease.  RECOMMENDATION: Treatment planning.  BI-RADS CATEGORY  6: Known biopsy-proven malignancy.   Electronically Signed   By: Pamelia Hoit M.D.   On: 02/10/2015 13:24    ASSESSMENT AND PLAN : 50 year old Caucasian female with screening detected right breast cancer  1. 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  --Given her strong ER/PR positivity, I strongly recommend her consider adjuvant endocrine therapy. She is premenopausal, I recommend tamoxifen for total of 10 years, or switch tamoxifen to aromatase inhibitor for additional 5 years when she becomes postmenopausal down the road. We discussed different side effects of tamoxifen and aromatase inhibitor. She is tolerating tamoxifen well, likely will stay on tamoxifen for 10 years. -Her [place] was 26 in 10/2015, she is likely in the perimenopause.  -she has been tolerating tamoxifen well, no hot flash, or other side effects, we'll continue. -She is doing very well, her lap and a physical exam are unremarkable. No concern for recurrence. -continue breast cancer surveillance with annual mammogram, and also consider breast MI due to her dense breast tissue (category C) if  her insurance coverage. -she is scheduled for mammogram in 01/2016    2. Genetics -Her genetic testing was negative for inheritable breast/ovarian syndrome.  Plan: She will see Dr. Lucia Gaskins in 2 months. I'll see her back in 6 months for follow up with lab.   All questions were answered. The patient knows to call the clinic with any problems, questions or concerns. I spent 15 minutes counseling the patient face to face. The total time spent in the appointment was 20 minutes and more than 50% was on counseling.       Truitt Merle, MD 12/30/2015

## 2016-02-17 ENCOUNTER — Encounter: Payer: Self-pay | Admitting: Obstetrics & Gynecology

## 2016-04-21 ENCOUNTER — Encounter (HOSPITAL_COMMUNITY): Payer: Self-pay

## 2016-04-28 ENCOUNTER — Other Ambulatory Visit: Payer: Self-pay | Admitting: Surgery

## 2016-04-28 DIAGNOSIS — C50911 Malignant neoplasm of unspecified site of right female breast: Secondary | ICD-10-CM

## 2016-05-10 ENCOUNTER — Ambulatory Visit
Admission: RE | Admit: 2016-05-10 | Discharge: 2016-05-10 | Disposition: A | Discharge status: Home / Self Care | Payer: BLUE CROSS/BLUE SHIELD | Source: Ambulatory Visit | Attending: Surgery | Admitting: Surgery

## 2016-05-10 ENCOUNTER — Encounter: Payer: Self-pay | Admitting: Radiology

## 2016-05-10 DIAGNOSIS — C50911 Malignant neoplasm of unspecified site of right female breast: Secondary | ICD-10-CM

## 2016-05-10 MED ORDER — GADOBENATE DIMEGLUMINE 529 MG/ML IV SOLN
11.0000 mL | Freq: Once | INTRAVENOUS | Status: AC | PRN
  Administered 2016-05-10: 11 mL via INTRAVENOUS

## 2016-06-29 NOTE — Progress Notes (Signed)
[place]  [phone] [phone]  Clinic follow up Note   Patient Care Team: [facility] as PCP - General Alphonsa Overall, MD as Consulting Physician (General Surgery) Truitt Merle, MD as Consulting Physician (Hematology) Thea Silversmith, MD as Consulting Physician (Radiation Oncology) Mauro Kaufmann, RN as Registered Nurse Rockwell Germany, RN as Registered Nurse Sylvan Cheese, NP as Nurse Practitioner (Nurse Practitioner) Wallace Going, DO as Consulting Physician (Plastic Surgery) Megan Salon, MD as Consulting Physician (Gynecology) 07/01/2016  CHIEF COMPLAINTS: Follow up right breast cancer   Oncology History   Breast cancer of lower-inner quadrant of right female breast [facility])   Staging form: Breast, AJCC 7th Edition     Clinical stage from 02/12/2015: Stage 0 (Tis (DCIS), N0, M0) - Unsigned       Staging comments: Staged at breast conference on 9.21.16      Pathologic stage from 03/13/2015: Stage IA (T1a, N0, cM0) - Signed by Truitt Merle, MD on 03/26/2015        Breast cancer of lower-inner quadrant of right female breast ([place])   01/28/2015 Mammogram    Screening mammogram showed new regional heterogeneous calcifications in the right breast central to the nipple midline depth.       02/06/2015 Initial Biopsy    Right breast core needle biopsy showed DCIS with calcifications and necrosis.      02/07/2015 Breast MRI    Breast MRI showed non-mass enhancement involves majority of the lower, outer quadrant of the right breast spanning at least 3.4 x 5.0 x 5.2 cm. No other lesion or adenopathy.      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 margins were negative for invasive carcinoma, but DCIS was involved in anterior margin, and broadly <0.1cm on other margins - none to cut      03/13/2015 Pathologic Stage    Stage IA: T1a N0      04/24/2015 -  Anti-estrogen oral therapy    Tamoxifen 43m daily. Potential change to AI once post menopausal. Planned duration of therapy 10 years.      06/05/2015 Survivorship    Survivorship visit completed and copy of care plan given to patient.       HISTORY OF PRESENTING ILLNESS (02/11/2015):  Carolyn Berg 51y.o. female is here because of newly diagnosed right breast cancer. She presents to our multidisciplinary breast clinic to discuss further management. She is accompanied by her husband.  This was discovered by screening mammogram. She is very compliant with year screening mammogram. Her screening mammogram on 01/28/2015 showed a new regional heterogeneous calcification in the lower-outer quadrant of right breast. She underwent core needle biopsy on 02/07/2015, which showed DCIS.  She feels well overall, denies any symptoms, no pain, dyspnea, weight loss or night sweats. She is a housewife, very physically active, no significant past medical history, no family history of breast cancer. She is premenopausal, takes birth control pill.  CURRENT THERAPY: Tamoxifen 217mdaily, started on 03/28/2015   INTERIM HISTORY Carolyn Berg for follow-up. She is  doing well. She is still taking her tamoxifen and tolerating it well. She has the occasional hot flash and night sweat. Denies any other concerns.   MEDICAL HISTORY:  Past Medical History:  Diagnosis Date  . Anxiety    anticipation of mastectomy   . Basal cell carcinoma of right thigh 05/2013  . Breast cancer of lower-inner quadrant of right female breast ([place]) 02/07/2015  . Fibroids     SURGICAL HISTORY: Past  Surgical History:  Procedure Laterality Date  . BREAST RECONSTRUCTION WITH PLACEMENT OF TISSUE EXPANDER AND FLEX HD (ACELLULAR HYDRATED DERMIS) Right 03/13/2015  . BREAST RECONSTRUCTION WITH PLACEMENT OF TISSUE EXPANDER AND FLEX HD (ACELLULAR HYDRATED DERMIS) Right 03/13/2015   Procedure: IMMEDIATE RIGHT BREAST RECONSTRUCTION WITH PLACEMENT OF TISSUE EXPANDER AND FLEX PLIABLE 4X16 ;  Surgeon: Loel Lofty Dillingham, DO;  Location: [place];  Service: Plastics;  Laterality: Right;  . BREAST SURGERY Right 01/2015  . FINGER FRACTURE SURGERY Left    "middle finger"  . MASTECTOMY COMPLETE / SIMPLE W/ SENTINEL NODE BIOPSY Right 03/13/2015  . PARTIAL MASTECTOMY WITH AXILLARY SENTINEL LYMPH NODE BIOPSY Right 03/13/2015   Procedure: RIGHT MASTECTOMY WITH AXILLARY SENTINEL LYMPH NODE BIOPSY;  Surgeon: Alphonsa Overall, MD;  Location: [place];  Service: General;  Laterality: Right;  . REMOVAL OF TISSUE EXPANDER AND PLACEMENT OF IMPLANT Right 06/23/2015   Procedure: REMOVAL OF RIGHT BREAST TISSUE EXPANDER AND PLACEMENT OF SILICONE IMPLANT;  Surgeon: Wallace Going, DO;  Location: [place];  Service: Plastics;  Laterality: Right;  . SKIN BIOPSY Right 05/2013   basal cell on thigh  . VAGINAL DELIVERY     /w epidural   . WISDOM TOOTH EXTRACTION  early 2000's   /w IV sedation    GYN HISTORY  Menarchal:13 LMP: 06/2015 Contraceptive: 20+ years  HRT: N/A  G1P1: daughter is 34, no breast feeding, no plan for more children   SOCIAL HISTORY: Social History   Social History  . Marital Status: Married    Spouse Name: N/A  . Number of Children: 1 daughter 26 yo   . Years of Education: N/A   Occupational History  . Not on file.   Social History Main Topics  . Smoking status: Never Smoker   . Smokeless tobacco: Never Used  . Alcohol Use: Yes     Comment: occ glass of wine  . Drug Use: No  . Sexual Activity:    Partners: Male    Birth Control/ Protection: Pill   Other Topics Concern  .  Not on file   Social History Narrative    FAMILY HISTORY: Family History  Problem Relation Age of Onset  . Diabetes Paternal Grandfather   . Heart attack Paternal Grandfather   . Colon cancer Maternal Grandfather 42    lived to 92  . Renal Disease Maternal Grandfather   . Breast cancer Other     MGF's mother dx over 56  . Lung cancer Maternal Uncle   . Stroke Maternal Grandmother     ALLERGIES:  has No Known Allergies.  MEDICATIONS:  Current Outpatient Prescriptions  Medication Sig Dispense Refill  . JUBLIA 10 % SOLN USE QD UTD  1  . tamoxifen (NOLVADEX) 20 MG tablet Take 1 tablet (20 mg total) by mouth daily. 90 tablet 3   No current facility-administered medications for this visit.     REVIEW OF SYSTEMS:   Constitutional: Denies fevers, chills (+) hot flashes, night sweats Eyes: Denies blurriness of vision, double vision  or watery eyes Ears, nose, mouth, throat, and face: Denies mucositis or sore throat Respiratory: Denies cough, dyspnea or wheezes Cardiovascular: Denies palpitation, chest discomfort or lower extremity swelling Gastrointestinal:  Denies nausea, heartburn or change in bowel habits Skin: Denies abnormal skin rashes Lymphatics: Denies new lymphadenopathy or easy bruising Neurological:Denies numbness, tingling or new weaknesses Behavioral/Psych: Mood is stable, no new changes  All other systems were reviewed with the patient and are negative.  PHYSICAL EXAMINATION: ECOG PERFORMANCE STATUS: 0  Vitals:   07/01/16 1624  BP: 105/64  Pulse: 69  Resp: 18  Temp: 97.9 F (36.6 C)   Filed Weights   07/01/16 1624  Weight: 124 lb 12.8 oz (56.6 kg)    GENERAL:alert, no distress and comfortable SKIN: skin color, texture, turgor are normal, no rashes or significant lesions EYES: normal, conjunctiva are pink and non-injected, sclera clear OROPHARYNX:no exudate, no erythema and lips, buccal mucosa, and tongue normal  NECK: supple, thyroid normal size,  non-tender, without nodularity LYMPH:  no palpable lymphadenopathy in the cervical, axillary or inguinal LUNGS: clear to auscultation and percussion with normal breathing effort HEART: regular rate & rhythm and no murmurs and no lower extremity edema ABDOMEN:abdomen soft, non-tender and normal bowel sounds Musculoskeletal:no cyanosis of digits and no clubbing  PSYCH: alert & oriented x 3 with fluent speech NEURO: no focal motor/sensory deficits Breasts: s/p right maostectomy and implant placement, old surgical scar well healed, no skin erythema. Palpation of left breast and axilla revealed no obvious mass that I could appreciate.  LABORATORY DATA:  I have reviewed the data as listed CBC Latest Ref Rng & Units 07/01/2016 12/30/2015 08/26/2015  WBC 3.9 - 10.3 10e3/uL 4.9 4.9 3.7(L)  Hemoglobin 11.6 - 15.9 g/dL 11.5(L) 11.8 12.0  Hematocrit 34.8 - 46.6 % 34.6(L) 35.3 36.5  Platelets 145 - 400 10e3/uL 241 273 251    CMP Latest Ref Rng & Units 07/01/2016 12/30/2015 08/26/2015  Glucose 70 - 140 mg/dl 89 83 89  BUN 7.0 - 26.0 mg/dL 19.3 17.6 18.3  Creatinine 0.6 - 1.1 mg/dL 0.8 0.9 0.9  Sodium 136 - 145 mEq/L 142 142 140  Potassium 3.5 - 5.1 mEq/L 4.0 4.0 4.6  Chloride 101 - 111 mmol/L - - -  CO2 22 - 29 mEq/L 27 30(H) 27  Calcium 8.4 - 10.4 mg/dL 8.9 9.2 8.7  Total Protein 6.4 - 8.3 g/dL 6.8 7.2 6.9  Total Bilirubin 0.20 - 1.20 mg/dL 0.33 0.39 0.39  Alkaline Phos 40 - 150 U/L 49 49 45  AST 5 - 34 U/L 22 22 22   ALT 0 - 55 U/L 17 20 14     PATHOLOGY REPORT   Diagnosis 06/23/2015 Scar -, Scar tissue right breast - BENIGN SKIN WITH UNDERLYING SCAR TISSUE FORMATION AND SCATTERED CHRONIC INFLAMMATION. - UNDERLYING BENIGN BREAST PARENCHYMA WITH FIBROCYSTIC CHANGES. - NO DYSPLASIA, ATYPIA OR MALIGNANCY IDENTIFIED.  RADIOGRAPHIC STUDIES: I have personally reviewed the radiological images as listed and agreed with the findings in the report. MR Breast Bilateral 05/10/2016 IMPRESSION: 1. Right  mastectomy with no recurrence. 2. No MRI evidence of malignancy in the left breast.  Mr Breast Bilateral W Wo Contrast 02/10/2015    FINDINGS: Breast composition: d. Extreme fibroglandular tissue.  Background parenchymal enhancement: Marked  Right breast: There is asymmetric non mass enhancement involving the majority of the lower, outer quadrant of the right breast when compared to the left. This non mass enhancement is difficult to precisely measure given the marked background enhancement. However, the non  mass enhancement appears to span at least 3.4 (AP) x 5.0 (TR) x 5.2 (CC) cm. Biopsy marker artifact corresponding to the known site of DCIS is seen within the inferolateral aspect of this enhancement in the lower, outer right breast, series 5, image 115.  Left breast: No dominant mass or suspicious enhancement. There is marked background parenchymal enhancement.  Lymph nodes: No abnormal appearing lymph nodes.  Ancillary findings:  None.   IMPRESSION: Asymmetric, non mass enhancement involves the majority of the lower, outer quadrant of the right breast spanning at least 3.4 x 5.0 x 5.2 cm. This non mass enhancement appears to correspond to the extent of calcifications seen mammographically. If breast conservation is being considered, additional stereotactic biopsy of the right breast may be warranted for further evaluation of extent of disease.  RECOMMENDATION: Treatment planning.  BI-RADS CATEGORY  6: Known biopsy-proven malignancy.   Electronically Signed   By: Pamelia Hoit M.D.   On: 02/10/2015 13:24    ASSESSMENT AND PLAN : 51 y.o. Caucasian female with screening detected right breast cancer  1. 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  -Given her strong ER/PR positivity, I strongly recommend her consider adjuvant endocrine therapy. She is  premenopausal, I recommend tamoxifen for total of 10 years, or switch tamoxifen to aromatase inhibitor for additional 5 years when she becomes postmenopausal down the road. We discussed different side effects of tamoxifen and aromatase inhibitor. She is tolerating tamoxifen well, likely will stay on tamoxifen for 10 years. -Her [place] was 26 in 10/2015, she is likely in the perimenopause.  -she has been tolerating tamoxifen well, no hot flash, or other side effects, we'll continue. -She is doing very well, her lab and a physical exam are unremarkable. No concern for recurrence. -continue breast cancer surveillance with annual mammogram, and also consider breast MI due to her dense breast tissue (category C) if her insurance coverage. -I reviewed the breast MRI with the patient, which showed no evidence of recurrent recurrence. This was ordered by Dr. Dalbert Batman as a baseline for future comparison if needed.  -I encouraged her to continue healthy diet and exercise.   2. Genetics -Her genetic testing was negative for inheritable breast/ovarian syndrome.  3. Anemia -She has oral iron at home that she occasionally takes -She has not had her period since 06/2015.  -Will add an iron study to her next visit.   Plan:  -Refill tamoxifen.  -I'll see her back in 6 months for follow up with lab.    All questions were answered. The patient knows to call the clinic with any problems, questions or concerns.  I spent 15 minutes counseling the patient face to face. The total time spent in the appointment was 20 minutes and more than 50% was on counseling.  This document serves as a record of services personally performed by Truitt Merle, MD. It was created on her behalf by Martinique Casey, a trained medical scribe. The creation of this record is based on the scribe's personal observations and the provider's statements to them. This document has been checked and approved by the attending provider.  I have reviewed the  above documentation for accuracy and completeness and I agree with the above.   Truitt Merle, MD 07/01/2016

## 2016-07-01 ENCOUNTER — Other Ambulatory Visit (HOSPITAL_BASED_OUTPATIENT_CLINIC_OR_DEPARTMENT_OTHER): Payer: BLUE CROSS/BLUE SHIELD

## 2016-07-01 ENCOUNTER — Ambulatory Visit (HOSPITAL_BASED_OUTPATIENT_CLINIC_OR_DEPARTMENT_OTHER): Payer: BLUE CROSS/BLUE SHIELD | Admitting: Hematology

## 2016-07-01 ENCOUNTER — Telehealth: Payer: Self-pay | Admitting: Hematology

## 2016-07-01 VITALS — BP 105/64 | HR 69 | Temp 97.9°F | Resp 18 | Ht 64.5 in | Wt 124.8 lb

## 2016-07-01 DIAGNOSIS — Z170 Estrogen receptor positive status [ER+]: Secondary | ICD-10-CM | POA: Diagnosis not present

## 2016-07-01 DIAGNOSIS — Z79810 Long term (current) use of selective estrogen receptor modulators (SERMs): Secondary | ICD-10-CM

## 2016-07-01 DIAGNOSIS — C50311 Malignant neoplasm of lower-inner quadrant of right female breast: Secondary | ICD-10-CM

## 2016-07-01 DIAGNOSIS — D649 Anemia, unspecified: Secondary | ICD-10-CM | POA: Diagnosis not present

## 2016-07-01 LAB — CBC WITH DIFFERENTIAL/PLATELET
BASO%: 0.2 % (ref 0.0–2.0)
BASOS ABS: 0 10*3/uL (ref 0.0–0.1)
EOS ABS: 0.1 10*3/uL (ref 0.0–0.5)
EOS%: 1 % (ref 0.0–7.0)
HCT: 34.6 % — ABNORMAL LOW (ref 34.8–46.6)
HEMOGLOBIN: 11.5 g/dL — AB (ref 11.6–15.9)
LYMPH%: 35.6 % (ref 14.0–49.7)
MCH: 30.1 pg (ref 25.1–34.0)
MCHC: 33.2 g/dL (ref 31.5–36.0)
MCV: 90.6 fL (ref 79.5–101.0)
MONO#: 0.4 10*3/uL (ref 0.1–0.9)
MONO%: 8.9 % (ref 0.0–14.0)
NEUT#: 2.7 10*3/uL (ref 1.5–6.5)
NEUT%: 54.3 % (ref 38.4–76.8)
Platelets: 241 10*3/uL (ref 145–400)
RBC: 3.82 10*6/uL (ref 3.70–5.45)
RDW: 13.3 % (ref 11.2–14.5)
WBC: 4.9 10*3/uL (ref 3.9–10.3)
lymph#: 1.8 10*3/uL (ref 0.9–3.3)

## 2016-07-01 LAB — COMPREHENSIVE METABOLIC PANEL
ALBUMIN: 3.7 g/dL (ref 3.5–5.0)
ALT: 17 U/L (ref 0–55)
AST: 22 U/L (ref 5–34)
Alkaline Phosphatase: 49 U/L (ref 40–150)
Anion Gap: 7 mEq/L (ref 3–11)
BUN: 19.3 mg/dL (ref 7.0–26.0)
CHLORIDE: 108 meq/L (ref 98–109)
CO2: 27 meq/L (ref 22–29)
Calcium: 8.9 mg/dL (ref 8.4–10.4)
Creatinine: 0.8 mg/dL (ref 0.6–1.1)
EGFR: 87 mL/min/{1.73_m2} — AB (ref >90)
GLUCOSE: 89 mg/dL (ref 70–140)
POTASSIUM: 4 meq/L (ref 3.5–5.1)
SODIUM: 142 meq/L (ref 136–145)
Total Bilirubin: 0.33 mg/dL (ref 0.20–1.20)
Total Protein: 6.8 g/dL (ref 6.4–8.3)

## 2016-07-01 MED ORDER — TAMOXIFEN CITRATE 20 MG PO TABS: 20.0000 mg | ORAL_TABLET | Freq: Every day | ORAL | 3 refills | Status: DC

## 2016-07-01 NOTE — Telephone Encounter (Signed)
Appointments scheduled per 2/8 LOS. Patient given AVS report and calendars with future scheduled appointments.  °

## 2016-07-02 ENCOUNTER — Encounter: Payer: Self-pay | Admitting: Hematology

## 2016-11-30 ENCOUNTER — Other Ambulatory Visit (HOSPITAL_COMMUNITY)
Admission: RE | Admit: 2016-11-30 | Discharge: 2016-11-30 | Disposition: A | Discharge status: Home / Self Care | Payer: BLUE CROSS/BLUE SHIELD | Source: Other Acute Inpatient Hospital | Attending: Hematology | Admitting: Hematology

## 2016-11-30 ENCOUNTER — Other Ambulatory Visit (HOSPITAL_BASED_OUTPATIENT_CLINIC_OR_DEPARTMENT_OTHER): Payer: BLUE CROSS/BLUE SHIELD

## 2016-11-30 DIAGNOSIS — D649 Anemia, unspecified: Secondary | ICD-10-CM

## 2016-11-30 DIAGNOSIS — C50311 Malignant neoplasm of lower-inner quadrant of right female breast: Secondary | ICD-10-CM

## 2016-11-30 LAB — COMPREHENSIVE METABOLIC PANEL
ALT: 17 U/L (ref 14–54)
ANION GAP: 6 (ref 5–15)
AST: 23 U/L (ref 15–41)
Albumin: 3.9 g/dL (ref 3.5–5.0)
Alkaline Phosphatase: 38 U/L (ref 38–126)
BILIRUBIN TOTAL: 0.5 mg/dL (ref 0.3–1.2)
BUN: 18 mg/dL (ref 6–20)
CO2: 28 mmol/L (ref 22–32)
Calcium: 9 mg/dL (ref 8.9–10.3)
Chloride: 104 mmol/L (ref 101–111)
Creatinine, Ser: 0.82 mg/dL (ref 0.44–1.00)
GFR calc Af Amer: >60 mL/min (ref >60)
GFR calc non Af Amer: >60 mL/min (ref >60)
Glucose, Bld: 114 mg/dL — ABNORMAL HIGH (ref 65–99)
POTASSIUM: 3.8 mmol/L (ref 3.5–5.1)
Sodium: 138 mmol/L (ref 135–145)
TOTAL PROTEIN: 7.3 g/dL (ref 6.5–8.1)

## 2016-11-30 LAB — CBC WITH DIFFERENTIAL/PLATELET
BASO%: 0.5 % (ref 0.0–2.0)
BASOS ABS: 0 10*3/uL (ref 0.0–0.1)
EOS%: 1 % (ref 0.0–7.0)
Eosinophils Absolute: 0 10*3/uL (ref 0.0–0.5)
HEMATOCRIT: 34.4 % — AB (ref 34.8–46.6)
HGB: 11.4 g/dL — ABNORMAL LOW (ref 11.6–15.9)
LYMPH#: 1.7 10*3/uL (ref 0.9–3.3)
LYMPH%: 41.4 % (ref 14.0–49.7)
MCH: 30.6 pg (ref 25.1–34.0)
MCHC: 33.1 g/dL (ref 31.5–36.0)
MCV: 92.2 fL (ref 79.5–101.0)
MONO#: 0.4 10*3/uL (ref 0.1–0.9)
MONO%: 9.2 % (ref 0.0–14.0)
NEUT#: 2 10*3/uL (ref 1.5–6.5)
NEUT%: 47.9 % (ref 38.4–76.8)
PLATELETS: 239 10*3/uL (ref 145–400)
RBC: 3.73 10*6/uL (ref 3.70–5.45)
RDW: 12.9 % (ref 11.2–14.5)
WBC: 4.1 10*3/uL (ref 3.9–10.3)

## 2016-12-01 LAB — FOLATE RBC
Folate, Hemolysate: 320.7 ng/mL
Folate, RBC: 946 ng/mL (ref >498)
HEMATOCRIT: 33.9 % — AB (ref 34.0–46.6)

## 2016-12-01 LAB — FERRITIN: Ferritin: 84 ng/ml (ref 9–269)

## 2016-12-01 LAB — IRON AND TIBC
%SAT: 25 % (ref 21–57)
Iron: 77 ug/dL (ref 41–142)
TIBC: 308 ug/dL (ref 236–444)
UIBC: 230 ug/dL (ref 120–384)

## 2016-12-01 LAB — VITAMIN B12: Vitamin B12: 437 pg/mL (ref 232–1245)

## 2016-12-06 NOTE — Progress Notes (Signed)
Carolyn Berg  Telephone:([phone]) [phone] Fax:(336) [phone]  Clinic follow up Note   Patient Care Team: [place] as PCP - General Carolyn Overall, MD as Consulting Physician (General Surgery) Carolyn Merle, MD as Consulting Physician (Hematology) Carolyn Silversmith, MD (Inactive) as Consulting Physician (Radiation Oncology) Carolyn Kaufmann, RN as Registered Nurse Carolyn Germany, RN as Registered Nurse Carolyn Berg, Carolyn Blamer, NP as Nurse Practitioner (Nurse Practitioner) Carolyn Roe Loel Lofty, DO as Consulting Physician (Plastic Surgery) Carolyn Salon, MD as Consulting Physician (Gynecology) 12/07/2016   CHIEF COMPLAINTS: Follow up right breast cancer   Oncology History   Breast cancer of lower-inner quadrant of right female breast Carolyn Berg)   Staging form: Breast, AJCC 7th Edition     Clinical stage from 02/12/2015: Stage 0 (Tis (DCIS), N0, M0) - Unsigned       Staging comments: Staged at breast conference on 9.21.16      Pathologic stage from 03/13/2015: Stage IA (T1a, N0, cM0) - Signed by Carolyn Merle, MD on 03/26/2015        Breast cancer of lower-inner quadrant of right female breast ([place])   01/28/2015 Mammogram    Screening mammogram showed new regional heterogeneous calcifications in the right breast central to the nipple midline depth.       02/06/2015 Initial Biopsy    Right breast core needle biopsy showed DCIS with calcifications and necrosis.      02/07/2015 Breast MRI    Breast MRI showed non-mass enhancement involves majority of the lower, outer quadrant of the right breast spanning at least 3.4 x 5.0 x 5.2 cm. No other lesion or adenopathy.      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 margins were negative for invasive carcinoma, but DCIS was involved in anterior margin, and broadly <0.1cm on other margins - none to cut      03/13/2015 Pathologic Stage    Stage IA: T1a N0      04/24/2015 -  Anti-estrogen oral therapy    Tamoxifen 84m daily. Potential change to AI once post menopausal. Planned duration of therapy 10 years.      06/05/2015 Survivorship    Survivorship visit completed and copy of care plan given to patient.       HISTORY OF PRESENTING ILLNESS (02/11/2015):  Carolyn Berg 51y.o. female is here because of newly diagnosed right breast cancer. She presents to our multidisciplinary breast clinic to discuss further management. She is accompanied by her husband.  This was discovered by screening mammogram. She is very compliant with year screening mammogram. Her screening mammogram on 01/28/2015 showed a new regional heterogeneous calcification in the lower-outer quadrant of right breast. She underwent core needle biopsy on 02/07/2015, which showed DCIS.  She feels well Berg, denies any symptoms, no pain, dyspnea, weight loss or night sweats. She is a housewife, very physically active, no significant past medical history, no family history of breast cancer. She is premenopausal, takes birth control pill.  CURRENT THERAPY: Tamoxifen 269mdaily, started on 03/28/2015  INTERIM HISTORY [name] for follow-up. She is  doing well. She reports some hot flashes which keep her up at night occasionally. She denies fatigue. She continues to go to the gym, and reports her activities of daily living are not affected. She denies mood changes. Berg, she is without complaint. She has not had a period since early 2017 since beginning Tamoxifen. The patient has had a colonoscopy.   She is looking forward to helping her  daughter move into college in [place] soon.   MEDICAL HISTORY:  Past Medical History:  Diagnosis Date  . Anxiety    anticipation of mastectomy   . Basal cell carcinoma of right thigh 05/2013  . Breast cancer of lower-inner quadrant of right female breast ([place]) 02/07/2015  . Fibroids     SURGICAL HISTORY: Past Surgical History:  Procedure Laterality Date  . BREAST RECONSTRUCTION WITH PLACEMENT OF TISSUE EXPANDER AND FLEX HD (ACELLULAR HYDRATED DERMIS) Right 03/13/2015  . BREAST RECONSTRUCTION WITH PLACEMENT OF TISSUE EXPANDER AND FLEX HD (ACELLULAR HYDRATED DERMIS) Right 03/13/2015   Procedure: IMMEDIATE RIGHT BREAST RECONSTRUCTION WITH PLACEMENT OF TISSUE EXPANDER AND FLEX PLIABLE 4X16 ;  Surgeon: Loel Berg Dillingham, DO;  Location: [facility];  Service: Plastics;  Laterality: Right;  . BREAST SURGERY Right 01/2015  . FINGER FRACTURE SURGERY Left    "middle finger"  . MASTECTOMY COMPLETE / SIMPLE W/ SENTINEL NODE BIOPSY Right 03/13/2015  . PARTIAL MASTECTOMY WITH AXILLARY SENTINEL LYMPH NODE BIOPSY Right 03/13/2015   Procedure: RIGHT MASTECTOMY WITH AXILLARY SENTINEL LYMPH NODE BIOPSY;  Surgeon: Carolyn Overall, MD;  Location: [facility];  Service: General;  Laterality: Right;  . REMOVAL OF TISSUE EXPANDER AND PLACEMENT OF IMPLANT Right 06/23/2015   Procedure: REMOVAL OF RIGHT BREAST TISSUE EXPANDER AND PLACEMENT OF SILICONE IMPLANT;  Surgeon: Carolyn Going, DO;  Location: [facility];  Service: Plastics;  Laterality: Right;  . SKIN BIOPSY Right 05/2013   basal cell on thigh  . VAGINAL DELIVERY     /w epidural   . WISDOM TOOTH EXTRACTION  early 2000's   /w IV sedation    GYN HISTORY  Menarchal:13 LMP: 06/2015 Contraceptive: 20+ years  HRT: N/A  G1P1: daughter is 60, no breast feeding, no plan for more children   SOCIAL HISTORY: Social History   Social History  . Marital Status: Married    Spouse Name: N/A  . Number of Children: 1 daughter 69 yo   . Years of Education:  N/A   Occupational History  . Not on file.   Social History Main Topics  . Smoking status: Never Smoker   . Smokeless tobacco: Never Used  . Alcohol Use: Yes     Comment: occ glass of wine  . Drug Use: No  . Sexual Activity:    Partners: Male    Birth Control/ Protection: Pill   Other Topics Concern  . Not on file   Social History Narrative    FAMILY HISTORY: Family History  Problem Relation Age of Onset  . Diabetes Paternal Grandfather   . Heart attack Paternal Grandfather   . Colon cancer Maternal Grandfather 43       lived to 61  . Renal Disease Maternal Grandfather   . Breast cancer Other        MGF's mother dx over 30  . Lung cancer Maternal Uncle   . Stroke Maternal Grandmother     ALLERGIES:  has No Known Allergies.  MEDICATIONS:  Current Outpatient Prescriptions  Medication Sig Dispense Refill  . tamoxifen (NOLVADEX) 20 MG  tablet Take 1 tablet (20 mg total) by mouth daily. 90 tablet 3  . JUBLIA 10 % SOLN USE QD UTD  1   No current facility-administered medications for this visit.     REVIEW OF SYSTEMS:  Constitutional: Denies fevers, chills (+) mild hot flashes, night sweats Eyes: Denies blurriness of vision, double vision or watery eyes Ears, nose, mouth, throat, and face: Denies mucositis or sore throat Respiratory: Denies cough, dyspnea or wheezes Cardiovascular: Denies palpitation, chest discomfort or lower extremity swelling Gastrointestinal:  Denies nausea, heartburn or change in bowel habits Skin: Denies abnormal skin rashes Lymphatics: Denies new lymphadenopathy or easy bruising Neurological:Denies numbness, tingling or new weaknesses Behavioral/Psych: Mood is stable, no new changes  All other systems were reviewed with the patient and are negative.  PHYSICAL EXAMINATION: ECOG PERFORMANCE STATUS: 0  Vitals:   12/07/16 1511  BP: (!) 109/59  Pulse: 86  Temp: 98.6 F (37 C)   Filed Weights   12/07/16 1511  Weight: 128 lb 6.4 oz (58.2  kg)    GENERAL:alert, no distress and comfortable SKIN: skin color, texture, turgor are normal, no rashes or significant lesions EYES: normal, conjunctiva are pink and non-injected, sclera clear OROPHARYNX:no exudate, no erythema and lips, buccal mucosa, and tongue normal  NECK: supple, thyroid normal size, non-tender, without nodularity LYMPH:  no palpable lymphadenopathy in the cervical, axillary or inguinal LUNGS: clear to auscultation and percussion with normal breathing effort HEART: regular rate & rhythm and no murmurs and no lower extremity edema ABDOMEN:abdomen soft, non-tender, and normal bowel sounds. No organomegaly. Musculoskeletal:no cyanosis of digits and no clubbing  PSYCH: alert & oriented x 3 with fluent speech NEURO: no focal motor/sensory deficits Breasts: s/p right mastectomy and implant placement, old surgical scar is well healed, no skin erythema. Palpation of left breast and axilla revealed no obvious masses that I could appreciate.  LABORATORY DATA:  I have reviewed the data as listed CBC Latest Ref Rng & Units 11/30/2016 11/30/2016 07/01/2016  WBC 3.9 - 10.3 10e3/uL 4.1 - 4.9  Hemoglobin 11.6 - 15.9 g/dL 11.4(L) - 11.5(L)  Hematocrit 34.0 - 46.6 % 33.9(L) 34.4(L) 34.6(L)  Platelets 145 - 400 10e3/uL 239 - 241    CMP Latest Ref Rng & Units 11/30/2016 07/01/2016 12/30/2015  Glucose 65 - 99 mg/dL 114(H) 89 83  BUN 6 - 20 mg/dL 18 19.3 17.6  Creatinine 0.44 - 1.00 mg/dL 0.82 0.8 0.9  Sodium 135 - 145 mmol/L 138 142 142  Potassium 3.5 - 5.1 mmol/L 3.8 4.0 4.0  Chloride 101 - 111 mmol/L 104 - -  CO2 22 - 32 mmol/L 28 27 30(H)  Calcium 8.9 - 10.3 mg/dL 9.0 8.9 9.2  Total Protein 6.5 - 8.1 g/dL 7.3 6.8 7.2  Total Bilirubin 0.3 - 1.2 mg/dL 0.5 0.33 0.39  Alkaline Phos 38 - 126 U/L 38 49 49  AST 15 - 41 U/L _0 ALT 14 - 54 U/L _1 PATHOLOGY REPORT   Diagnosis 06/23/2015 Scar -, Scar tissue right breast - BENIGN SKIN WITH UNDERLYING SCAR TISSUE  FORMATION AND SCATTERED CHRONIC INFLAMMATION. - UNDERLYING BENIGN BREAST PARENCHYMA WITH FIBROCYSTIC CHANGES. - NO DYSPLASIA, ATYPIA OR MALIGNANCY IDENTIFIED.  RADIOGRAPHIC STUDIES: I have personally reviewed the radiological images as listed and agreed with the findings in the report.  MR Breast Bilateral 05/10/2016 IMPRESSION: 1. Right mastectomy with no recurrence. 2. No MRI evidence of malignancy in the left breast.  Mr Breast Bilateral W Wo  Contrast 02/10/2015    FINDINGS: Breast composition: d. Extreme fibroglandular tissue.  Background parenchymal enhancement: Marked  Right breast: There is asymmetric non mass enhancement involving the majority of the lower, outer quadrant of the right breast when compared to the left. This non mass enhancement is difficult to precisely measure given the marked background enhancement. However, the non mass enhancement appears to span at least 3.4 (AP) x 5.0 (TR) x 5.2 (CC) cm. Biopsy marker artifact corresponding to the known site of DCIS is seen within the inferolateral aspect of this enhancement in the lower, outer right breast, series 5, image 115.  Left breast: No dominant mass or suspicious enhancement. There is marked background parenchymal enhancement.  Lymph nodes: No abnormal appearing lymph nodes.  Ancillary findings:  None.   IMPRESSION: Asymmetric, non mass enhancement involves the majority of the lower, outer quadrant of the right breast spanning at least 3.4 x 5.0 x 5.2 cm. This non mass enhancement appears to correspond to the extent of calcifications seen mammographically. If breast conservation is being considered, additional stereotactic biopsy of the right breast may be warranted for further evaluation of extent of disease.  RECOMMENDATION: Treatment planning.  BI-RADS CATEGORY  6: Known biopsy-proven malignancy.   Electronically Signed   By: Pamelia Hoit M.D.   On: 02/10/2015 13:24    ASSESSMENT AND PLAN : 51 y.o. Caucasian female with  screening detected right breast cancer  1. 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 -Her [place] was 26 in 10/2015, she is likely in the perimenopause.  -she has been tolerating tamoxifen well, mild hot flash, no other side effects, we'll continue. -She is doing very well, her lab and a physical exam are unremarkable. No concern for recurrence. -continue breast cancer surveillance with annual mammogram, screening breast MRI can be considered if her insurance coverage, but not standard recommendation (her dense breast tissue (category C))  -I previously reviewed the breast MRI with the patient, which showed no evidence of recurrent recurrence. This was ordered by Dr. Lucia Gaskins as a baseline for future comparison if needed. The patient hopes to avoid additional breast MRI in the future unless necessary. -I have encouraged her to continue healthy diet and exercise. - The patient is due for mammogram in September at [facility].   2. Genetics -Her genetic testing was negative for inheritable breast/ovarian syndrome.  3. Anemia, chronic -She has oral iron at home that she occasionally takes -She has not had her period since 06/2015.  - Labs reviewed today, her iron studies, B16 and folic acid level are within normal limits, hemoglobin 11.4, she is mildly anemic. - Check labs in 6 months. She has had anemia since young age, we'll obtain a hemoglobin electrophoresis next time.  Plan:  - Patient will continue with regular yearly mammogram; next due in September. - Continue tamoxifen. No refills needed at this time. - Follow up in 6 months with labs, may change to annual visit after next   All questions were answered. The patient knows to call the clinic with any problems,  questions or concerns.  I spent 15 minutes counseling the patient face to face. The total time spent in the appointment was 20 minutes and more than 50% was on counseling.  This document serves as a  record of services personally performed by Carolyn Merle, MD. It was created on her behalf by Maryla Morrow, a trained medical scribe. The creation of this record is based on the scribe's personal observations and the provider's statements to them. This document has been checked and approved by the attending provider.  I have reviewed the above documentation for accuracy and completeness and I agree with the above.   Carolyn Merle, MD 12/07/2016

## 2016-12-07 ENCOUNTER — Ambulatory Visit (HOSPITAL_BASED_OUTPATIENT_CLINIC_OR_DEPARTMENT_OTHER): Payer: BLUE CROSS/BLUE SHIELD | Admitting: Hematology

## 2016-12-07 ENCOUNTER — Telehealth: Payer: Self-pay | Admitting: Hematology

## 2016-12-07 ENCOUNTER — Encounter: Payer: Self-pay | Admitting: Hematology

## 2016-12-07 VITALS — BP 109/59 | HR 86 | Temp 98.6°F | Ht 64.5 in | Wt 128.4 lb

## 2016-12-07 DIAGNOSIS — D649 Anemia, unspecified: Secondary | ICD-10-CM | POA: Diagnosis not present

## 2016-12-07 DIAGNOSIS — Z79810 Long term (current) use of selective estrogen receptor modulators (SERMs): Secondary | ICD-10-CM

## 2016-12-07 DIAGNOSIS — Z170 Estrogen receptor positive status [ER+]: Secondary | ICD-10-CM

## 2016-12-07 DIAGNOSIS — C50311 Malignant neoplasm of lower-inner quadrant of right female breast: Secondary | ICD-10-CM | POA: Diagnosis not present

## 2016-12-07 NOTE — Telephone Encounter (Signed)
Scheduled appt per 7/17 los - Gave patient AVS and calender per los, - lab and f/u in 6 months.

## 2017-02-09 ENCOUNTER — Telehealth: Payer: Self-pay | Admitting: Obstetrics & Gynecology

## 2017-02-09 NOTE — Telephone Encounter (Signed)
Left patient a message to call back to reschedule a future appointment that was cancelled by the provider for her AEX. °

## 2017-02-18 ENCOUNTER — Ambulatory Visit: Payer: BLUE CROSS/BLUE SHIELD | Admitting: Obstetrics & Gynecology

## 2017-03-22 ENCOUNTER — Encounter: Payer: Self-pay | Admitting: Obstetrics and Gynecology

## 2017-03-22 ENCOUNTER — Ambulatory Visit (INDEPENDENT_AMBULATORY_CARE_PROVIDER_SITE_OTHER): Payer: BLUE CROSS/BLUE SHIELD | Admitting: Obstetrics and Gynecology

## 2017-03-22 VITALS — BP 100/60 | HR 82 | Resp 14 | Ht 65.0 in | Wt 128.0 lb

## 2017-03-22 DIAGNOSIS — Z01419 Encounter for gynecological examination (general) (routine) without abnormal findings: Secondary | ICD-10-CM

## 2017-03-22 DIAGNOSIS — R7309 Other abnormal glucose: Secondary | ICD-10-CM | POA: Diagnosis not present

## 2017-03-22 DIAGNOSIS — N951 Menopausal and female climacteric states: Secondary | ICD-10-CM | POA: Diagnosis not present

## 2017-03-22 NOTE — Patient Instructions (Signed)

## 2017-03-22 NOTE — Progress Notes (Signed)
51 y.o. G1P1 Married Caucasian female here for annual exam.    Her oncologist is Dr. Burr Medico and Dr. Lucia Gaskins is her surgeon.  Status post right mastectomy. She is on Tamoxifen and has some hot flashes. These can wake her up.   Asking about shingles vaccine.  Mother has had shingles.   PCP:   None per patient. Went to [facility] in the past.  Patient's last menstrual period was 07/09/2015.           Sexually active: Yes.    The current method of family planning is post menopausal status.    Exercising: Yes.    weights, pilates, yoga, cardio  Smoker:  no  Health Maintenance: Pap:  11/14/15 Pap and HR HPV negative  08/31/2013-WNL History of abnormal Pap:  No. MMG:  01/30/16 Left Breast - BIRADS 1 negative/density c -- 05/10/16 MR BILATERAL BREAST BIRADS 2 benign -- 02/2015-R Breast Mastectomy & Reconstruction.  Verbal report from [facility] today - mammogram 02/10/17 - BI-RADS1, density rating C. Colonoscopy: 04/2016 with Dr. Collene Mares  BMD:   never TDaP:  2010 Gardasil:   n/a HIV: never.  Tested in pregnancy. Hep C: not indicated  Screening Labs:  Hb today: oncologist   reports that she has never smoked. She has never used smokeless tobacco. She reports that she drinks alcohol. She reports that she does not use drugs.  Past Medical History:  Diagnosis Date  . Anxiety    anticipation of mastectomy   . Basal cell carcinoma of right thigh 05/2013  . Breast cancer of lower-inner quadrant of right female breast ([place]) 02/07/2015  . Fibroids     Past Surgical History:  Procedure Laterality Date  . BREAST RECONSTRUCTION WITH PLACEMENT OF TISSUE EXPANDER AND FLEX HD (ACELLULAR HYDRATED DERMIS) Right 03/13/2015  . BREAST RECONSTRUCTION WITH PLACEMENT OF TISSUE EXPANDER AND FLEX HD (ACELLULAR HYDRATED DERMIS) Right 03/13/2015   Procedure: IMMEDIATE RIGHT BREAST RECONSTRUCTION WITH PLACEMENT OF TISSUE EXPANDER AND FLEX PLIABLE 4X16 ;  Surgeon: Loel Lofty Dillingham, DO;  Location: [facility];  Service: Plastics;   Laterality: Right;  . BREAST SURGERY Right 01/2015  . FINGER FRACTURE SURGERY Left    "middle finger"  . MASTECTOMY COMPLETE / SIMPLE W/ SENTINEL NODE BIOPSY Right 03/13/2015  . PARTIAL MASTECTOMY WITH AXILLARY SENTINEL LYMPH NODE BIOPSY Right 03/13/2015   Procedure: RIGHT MASTECTOMY WITH AXILLARY SENTINEL LYMPH NODE BIOPSY;  Surgeon: Alphonsa Overall, MD;  Location: [facility];  Service: General;  Laterality: Right;  . REMOVAL OF TISSUE EXPANDER AND PLACEMENT OF IMPLANT Right 06/23/2015   Procedure: REMOVAL OF RIGHT BREAST TISSUE EXPANDER AND PLACEMENT OF SILICONE IMPLANT;  Surgeon: Wallace Going, DO;  Location: [facility];  Service: Plastics;  Laterality: Right;  . SKIN BIOPSY Right 05/2013   basal cell on thigh  . VAGINAL DELIVERY     /w epidural   . WISDOM TOOTH EXTRACTION  early 2000's   /w IV sedation     Current Outpatient Prescriptions  Medication Sig Dispense Refill  . tamoxifen (NOLVADEX) 20 MG tablet Take 1 tablet (20 mg total) by mouth daily. 90 tablet 3   No current facility-administered medications for this visit.     Family History  Problem Relation Age of Onset  . Diabetes Paternal Grandfather   . Heart attack Paternal Grandfather   . Colon cancer Maternal Grandfather 47       lived to 40  . Renal Disease Maternal Grandfather   . Breast cancer Other  MGF's mother dx over 37  . Lung cancer Maternal Uncle   . Stroke Maternal Grandmother     ROS:  Pertinent items are noted in HPI.  Otherwise, a comprehensive ROS was negative.  Exam:   BP 100/60 (BP Location: Left Arm, Patient Position: Sitting, Cuff Size: Normal)   Pulse 82   Resp 14   Ht 5\' 5"  (1.651 m)   Wt 128 lb (58.1 kg)   LMP 07/09/2015   BMI 21.30 kg/m     General appearance: alert, cooperative and appears stated age Head: Normocephalic, without obvious abnormality, atraumatic Neck: no adenopathy, supple, symmetrical, trachea midline and thyroid normal to inspection and  palpation Lungs: clear to auscultation bilaterally Breasts: right breast absent with implant in place.  Left breast with normal appearance, no masses or tenderness, No nipple retraction or dimpling, No nipple discharge or bleeding, No axillary or supraclavicular adenopathy Heart: regular rate and rhythm Abdomen: soft, non-tender; no masses, no organomegaly Extremities: extremities normal, atraumatic, no cyanosis or edema Skin: Skin color, texture, turgor normal. No rashes or lesions Lymph nodes: Cervical, supraclavicular, and axillary nodes normal. No abnormal inguinal nodes palpated Neurologic: Grossly normal  Pelvic: External genitalia:  no lesions              Urethra:  normal appearing urethra with no masses, tenderness or lesions              Bartholins and Skenes: normal                 Vagina: normal appearing vagina with normal color and discharge, no lesions              Cervix: no lesions              Pap taken: No. Bimanual Exam:  Uterus:  normal size, contour, position, consistency, mobility, non-tender              Adnexa: no mass, fullness, tenderness              Rectal exam: Yes.  .  Confirms.              Anus:  normal sphincter tone, no lesions  Chaperone was present for exam.  Assessment:   Well woman visit with normal exam. Status post right mastectomy with reconstruction.  Estrogen and progesterone positive breast cancer.  On Tamoxifen with menopausal symptoms. Genetic testing negative.  Elevated glucose on recent labs.  Plan: Mammogram yearly.  Recommended self breast awareness. Pap and HR HPV as above. Guidelines for Calcium, Vitamin D, regular exercise program including cardiovascular and weight bearing exercise. Check FSH, Hgb A1C, lipids. We talked about the shingles vaccine.  She will call for any vaginal bleeding or spotting.  Follow up annually and prn.    After visit summary provided.

## 2017-03-23 LAB — LIPID PANEL
CHOLESTEROL TOTAL: 175 mg/dL (ref 100–199)
Chol/HDL Ratio: 2.1 ratio (ref 0.0–4.4)
HDL: 84 mg/dL (ref >39)
LDL CALC: 75 mg/dL (ref 0–99)
TRIGLYCERIDES: 78 mg/dL (ref 0–149)
VLDL CHOLESTEROL CAL: 16 mg/dL (ref 5–40)

## 2017-03-23 LAB — FOLLICLE STIMULATING HORMONE: FSH: 14.1 m[IU]/mL

## 2017-03-23 LAB — HEMOGLOBIN A1C
Est. average glucose Bld gHb Est-mCnc: 105 mg/dL
Hgb A1c MFr Bld: 5.3 % (ref 4.8–5.6)

## 2017-03-25 ENCOUNTER — Telehealth: Payer: Self-pay | Admitting: *Deleted

## 2017-03-25 ENCOUNTER — Encounter: Payer: Self-pay | Admitting: Obstetrics and Gynecology

## 2017-03-25 NOTE — Telephone Encounter (Signed)
My Chart message from patient:  Message   I have one question about the results from the recent bloodwork. After seeing the numbers and your comments, would you consider it safe for me to discontinue using birth control?   Have a wonderful weekend, and thanks for your help this week.  Carolyn Berg            Select Font Size      [name]  03/25/2017  Patient Email  MRN:  [id number]  Description: 51 year old female Provider: Nunzio Cobbs, MD Department: [facility]

## 2017-03-25 NOTE — Telephone Encounter (Signed)
Call to patient regarding My Chart message.    Advised that per Dr Quincy Simmonds result note, she is peri-menopausal and therefore she should continue contraception. Although pregnacny not likely, it cannot be ruled out and contraception is recommended. Patient agreeable.  Routing to provider for final review. Patient agreeable to disposition. Will close encounter.

## 2017-03-29 ENCOUNTER — Encounter: Payer: Self-pay | Admitting: Obstetrics & Gynecology

## 2017-03-29 LAB — SPECIMEN STATUS REPORT

## 2017-03-29 LAB — ESTRADIOL: Estradiol: <5 pg/mL

## 2017-05-29 ENCOUNTER — Telehealth: Payer: Self-pay | Admitting: Hematology

## 2017-05-29 NOTE — Telephone Encounter (Signed)
lvm advising appt chgd from 1/14 to 1/24 @ 145.

## 2017-06-09 ENCOUNTER — Other Ambulatory Visit: Payer: BLUE CROSS/BLUE SHIELD

## 2017-06-09 ENCOUNTER — Ambulatory Visit: Payer: BLUE CROSS/BLUE SHIELD | Admitting: Hematology

## 2017-06-16 ENCOUNTER — Encounter: Payer: Self-pay | Admitting: Hematology

## 2017-06-16 ENCOUNTER — Telehealth: Payer: Self-pay | Admitting: Hematology

## 2017-06-16 ENCOUNTER — Inpatient Hospital Stay: Payer: BLUE CROSS/BLUE SHIELD | Attending: Hematology

## 2017-06-16 ENCOUNTER — Inpatient Hospital Stay (HOSPITAL_BASED_OUTPATIENT_CLINIC_OR_DEPARTMENT_OTHER): Payer: BLUE CROSS/BLUE SHIELD | Admitting: Hematology

## 2017-06-16 VITALS — BP 112/55 | HR 85 | Temp 98.1°F | Resp 17 | Ht 65.0 in | Wt 131.9 lb

## 2017-06-16 DIAGNOSIS — D649 Anemia, unspecified: Secondary | ICD-10-CM

## 2017-06-16 DIAGNOSIS — C50511 Malignant neoplasm of lower-outer quadrant of right female breast: Secondary | ICD-10-CM | POA: Insufficient documentation

## 2017-06-16 DIAGNOSIS — Z170 Estrogen receptor positive status [ER+]: Secondary | ICD-10-CM | POA: Insufficient documentation

## 2017-06-16 DIAGNOSIS — Z79810 Long term (current) use of selective estrogen receptor modulators (SERMs): Secondary | ICD-10-CM

## 2017-06-16 DIAGNOSIS — C50311 Malignant neoplasm of lower-inner quadrant of right female breast: Secondary | ICD-10-CM

## 2017-06-16 DIAGNOSIS — Z9011 Acquired absence of right breast and nipple: Secondary | ICD-10-CM | POA: Insufficient documentation

## 2017-06-16 LAB — CBC WITH DIFFERENTIAL/PLATELET
Basophils Absolute: 0 10*3/uL (ref 0.0–0.1)
Basophils Relative: 0 %
EOS ABS: 0 10*3/uL (ref 0.0–0.5)
EOS PCT: 1 %
HCT: 34.6 % — ABNORMAL LOW (ref 34.8–46.6)
Hemoglobin: 11.5 g/dL — ABNORMAL LOW (ref 11.6–15.9)
LYMPHS ABS: 1.9 10*3/uL (ref 0.9–3.3)
Lymphocytes Relative: 43 %
MCH: 30.7 pg (ref 25.1–34.0)
MCHC: 33.2 g/dL (ref 31.5–36.0)
MCV: 92.5 fL (ref 79.5–101.0)
Monocytes Absolute: 0.3 10*3/uL (ref 0.1–0.9)
Monocytes Relative: 7 %
Neutro Abs: 2.1 10*3/uL (ref 1.5–6.5)
Neutrophils Relative %: 49 %
Platelets: 235 10*3/uL (ref 145–400)
RBC: 3.74 MIL/uL (ref 3.70–5.45)
RDW: 13 % (ref 11.2–16.1)
WBC: 4.4 10*3/uL (ref 3.9–10.3)

## 2017-06-16 LAB — COMPREHENSIVE METABOLIC PANEL
ALT: 13 U/L (ref 0–55)
AST: 21 U/L (ref 5–34)
Albumin: 3.6 g/dL (ref 3.5–5.0)
Alkaline Phosphatase: 45 U/L (ref 40–150)
Anion gap: 8 (ref 3–11)
BUN: 17 mg/dL (ref 7–26)
CHLORIDE: 105 mmol/L (ref 98–109)
CO2: 27 mmol/L (ref 22–29)
CREATININE: 1.02 mg/dL (ref 0.60–1.10)
Calcium: 8.8 mg/dL (ref 8.4–10.4)
GFR calc Af Amer: >60 mL/min (ref >60)
Glucose, Bld: 141 mg/dL — ABNORMAL HIGH (ref 70–140)
Potassium: 4.1 mmol/L (ref 3.3–4.7)
SODIUM: 140 mmol/L (ref 136–145)
Total Bilirubin: 0.3 mg/dL (ref 0.2–1.2)
Total Protein: 6.6 g/dL (ref 6.4–8.3)

## 2017-06-16 MED ORDER — TAMOXIFEN CITRATE 20 MG PO TABS: 20.0000 mg | ORAL_TABLET | Freq: Every day | ORAL | 3 refills | Status: DC

## 2017-06-16 NOTE — Progress Notes (Signed)
Carolyn Berg  [phone] [phone]  Clinic follow up Note   Patient Care Team: [place], [facility] as PCP - General Carolyn Overall, MD as Consulting Physician (General Surgery) Carolyn Merle, MD as Consulting Physician (Hematology) Carolyn Silversmith, MD (Inactive) as Consulting Physician (Radiation Oncology) Carolyn Kaufmann, RN as Registered Nurse Carolyn Germany, RN as Registered Nurse Carolyn Berg, Carolyn Blamer, NP as Nurse Practitioner (Nurse Practitioner) Carolyn Berg, Carolyn Lofty, DO as Consulting Physician (Plastic Surgery) Carolyn Salon, MD as Consulting Physician (Gynecology)   Date of Service:  06/16/2017   CHIEF COMPLAINTS: Follow up right breast cancer   Oncology History   Breast cancer of lower-inner quadrant of right female breast [facility])   Staging form: Breast, AJCC 7th Edition     Clinical stage from 02/12/2015: Stage 0 (Tis (DCIS), N0, M0) - Unsigned       Staging comments: Staged at breast conference on 9.21.16      Pathologic stage from 03/13/2015: Stage IA (T1a, N0, cM0) - Signed by Carolyn Merle, MD on 03/26/2015        Breast cancer of lower-inner quadrant of right female breast ([place])   01/28/2015 Mammogram    Screening mammogram showed new regional heterogeneous calcifications in the right breast central to the nipple midline depth.       02/06/2015 Initial Biopsy    Right breast core needle biopsy showed DCIS with calcifications and necrosis.      02/07/2015 Breast MRI    Breast MRI showed non-mass enhancement involves majority of the lower, outer quadrant of the right breast spanning at least 3.4 x 5.0 x 5.2 cm. No other lesion or adenopathy.      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 margins were negative for invasive carcinoma, but DCIS was involved in anterior margin, and broadly <0.1cm on other margins - none to cut      03/13/2015 Pathologic Stage    Stage IA: T1a N0      04/24/2015 -  Anti-estrogen oral therapy    Tamoxifen 3m daily. Potential change to AI once post menopausal. Planned duration of therapy 10 years.      06/05/2015 Survivorship    Survivorship visit completed and copy of care plan given to patient.       HISTORY OF PRESENTING ILLNESS (02/11/2015):  Carolyn Berg 52y.o. female is here because of newly diagnosed right breast cancer. She presents to our multidisciplinary breast clinic to discuss further management. She is accompanied by her husband.  This was discovered by screening mammogram. She is very compliant with year screening mammogram. Her screening mammogram on 01/28/2015 showed a new regional heterogeneous calcification in the lower-outer quadrant of right breast. She underwent core needle biopsy on 02/07/2015, which showed DCIS.  She feels well Berg, denies any symptoms, no pain, dyspnea, weight loss or night sweats. She is a housewife, very physically active, no significant past medical history, no family history of breast cancer. She is premenopausal, takes birth control pill.  CURRENT THERAPY: Tamoxifen 255mdaily, started on 03/28/2015  INTERIM HISTORY  [name] returns for follow-up. She was last seen by me 6 months ago. She presents to the clinic today noting she is doing well Berg. She notes she is tolerating tamoxifen well. She experiences hot flashes mostly at night. It does not impact her sleep. She rather not try medication for this. She recently went to Dr. Quincy Berg as her regular GYN was not open. Even though she no longer has periods her Estradial and FSH  levels still has her in perimenopause when Dr. Quincy Berg checked them. She notes she has a different insurance this year and has [facility]. She notes she needs a refill for Tamoxifen, preferably a 90 day supply.     MEDICAL HISTORY:  Past Medical History:  Diagnosis Date  . Anxiety    anticipation of mastectomy   . Basal cell carcinoma of right thigh 05/2013  . Breast cancer of lower-inner quadrant of right female breast ([place]) 02/07/2015  . Fibroids     SURGICAL HISTORY: Past Surgical History:  Procedure Laterality Date  . BREAST RECONSTRUCTION WITH PLACEMENT OF TISSUE EXPANDER AND FLEX HD (ACELLULAR HYDRATED DERMIS) Right 03/13/2015  . BREAST RECONSTRUCTION WITH PLACEMENT OF TISSUE EXPANDER AND FLEX HD (ACELLULAR HYDRATED DERMIS) Right 03/13/2015   Procedure: IMMEDIATE RIGHT BREAST RECONSTRUCTION WITH PLACEMENT OF TISSUE EXPANDER AND FLEX PLIABLE 4X16 ;  Surgeon: Carolyn Lofty Dillingham, DO;  Location: [place];  Service: Plastics;  Laterality: Right;  . BREAST SURGERY Right 01/2015  . FINGER FRACTURE SURGERY Left    "middle finger"  . MASTECTOMY COMPLETE / SIMPLE W/ SENTINEL NODE BIOPSY Right 03/13/2015  . PARTIAL MASTECTOMY WITH AXILLARY SENTINEL LYMPH NODE BIOPSY Right 03/13/2015   Procedure: RIGHT MASTECTOMY WITH AXILLARY SENTINEL LYMPH NODE BIOPSY;  Surgeon: Carolyn Overall, MD;  Location: [place];  Service: General;  Laterality: Right;  . REMOVAL OF TISSUE EXPANDER AND PLACEMENT OF IMPLANT Right 06/23/2015   Procedure: REMOVAL OF RIGHT BREAST TISSUE EXPANDER AND PLACEMENT OF SILICONE IMPLANT;  Surgeon: Carolyn Going, DO;  Location: [place];  Service: Plastics;  Laterality: Right;  . SKIN BIOPSY Right 05/2013   basal cell on thigh  . VAGINAL DELIVERY     /w epidural   . WISDOM TOOTH EXTRACTION  early 2000's   /w IV sedation    GYN HISTORY  Menarchal:13 LMP: 06/2015 Contraceptive: 20+ years  HRT: N/A  G1P1: daughter is 38, no breast  feeding, no plan for more children   SOCIAL HISTORY: Social History   Social History  . Marital Status: Married    Spouse Name: N/A  . Number of Children: 1 daughter 24 yo   . Years of Education: N/A   Occupational History  . Not on file.   Social History Main Topics  . Smoking status: Never Smoker   . Smokeless tobacco: Never Used  . Alcohol Use: Yes     Comment: occ glass of wine  . Drug Use: No  . Sexual Activity:    Partners: Male    Birth Control/ Protection: Pill   Other Topics Concern  . Not on file   Social History Narrative    FAMILY HISTORY: Family History  Problem Relation Age of Onset  . Diabetes Paternal Grandfather   . Heart attack Paternal Grandfather   . Colon cancer Maternal Grandfather 24       lived to 18  . Renal Disease Maternal Grandfather   . Breast cancer Other  MGF's mother dx over 14  . Lung cancer Maternal Uncle   . Stroke Maternal Grandmother     ALLERGIES:  has No Known Allergies.  MEDICATIONS:  Current Outpatient Medications  Medication Sig Dispense Refill  . tamoxifen (NOLVADEX) 20 MG tablet Take 1 tablet (20 mg total) by mouth daily. 90 tablet 3   No current facility-administered medications for this visit.     REVIEW OF SYSTEMS:  Constitutional: Denies fevers, chills (+) mild hot flashes, occasional night sweats, manageable  Eyes: Denies blurriness of vision, double vision or watery eyes Ears, nose, mouth, throat, and face: Denies mucositis or sore throat Respiratory: Denies cough, dyspnea or wheezes Cardiovascular: Denies palpitation, chest discomfort or lower extremity swelling Gastrointestinal:  Denies nausea, heartburn or change in bowel habits Skin: Denies abnormal skin rashes Lymphatics: Denies new lymphadenopathy or easy bruising Neurological:Denies numbness, tingling or new weaknesses Behavioral/Psych: Mood is stable, no new changes  All other systems were reviewed with the patient and are  negative.   PHYSICAL EXAMINATION: ECOG PERFORMANCE STATUS: 0  Vitals:   06/16/17 1428  BP: (!) 112/55  Pulse: 85  Resp: 17  Temp: 98.1 F (36.7 C)  SpO2: 100%   Filed Weights   06/16/17 1428  Weight: 131 lb 14.4 oz (59.8 kg)    GENERAL:alert, no distress and comfortable SKIN: skin color, texture, turgor are normal, no rashes or significant lesions EYES: normal, conjunctiva are pink and non-injected, sclera clear OROPHARYNX:no exudate, no erythema and lips, buccal mucosa, and tongue normal  NECK: supple, thyroid normal size, non-tender, without nodularity LYMPH:  no palpable lymphadenopathy in the cervical, axillary or inguinal LUNGS: clear to auscultation and percussion with normal breathing effort HEART: regular rate & rhythm and no murmurs and no lower extremity edema ABDOMEN:abdomen soft, non-tender, and normal bowel sounds. No organomegaly. Musculoskeletal:no cyanosis of digits and no clubbing  PSYCH: alert & oriented x 3 with fluent speech NEURO: no focal motor/sensory deficits Breasts: s/p right mastectomy and implant placement, old surgical scar is well healed, no skin erythema. Palpation of left breast and axilla revealed no obvious masses that I could appreciate.  LABORATORY DATA:  I have reviewed the data as listed CBC Latest Ref Rng & Units 06/16/2017 11/30/2016 11/30/2016  WBC 3.9 - 10.3 K/uL 4.4 4.1 -  Hemoglobin 11.6 - 15.9 g/dL 11.5(L) 11.4(L) -  Hematocrit 34.8 - 46.6 % 34.6(L) 33.9(L) 34.4(L)  Platelets 145 - 400 K/uL 235 239 -    CMP Latest Ref Rng & Units 06/16/2017 11/30/2016 07/01/2016  Glucose 70 - 140 mg/dL 141(H) 114(H) 89  BUN 7 - 26 mg/dL 17 18 19.3  Creatinine 0.60 - 1.10 mg/dL 1.02 0.82 0.8  Sodium 136 - 145 mmol/L 140 138 142  Potassium 3.3 - 4.7 mmol/L 4.1 3.8 4.0  Chloride 98 - 109 mmol/L 105 104 -  CO2 22 - 29 mmol/L 27 28 27   Calcium 8.4 - 10.4 mg/dL 8.8 9.0 8.9  Total Protein 6.4 - 8.3 g/dL 6.6 7.3 6.8  Total Bilirubin 0.2 - 1.2 mg/dL  0.3 0.5 0.33  Alkaline Phos 40 - 150 U/L 45 38 49  AST 5 - 34 U/L 21 23 22   ALT 0 - 55 U/L 13 17 17     PATHOLOGY REPORT   Diagnosis 06/23/2015 Scar -, Scar tissue right breast - BENIGN SKIN WITH UNDERLYING SCAR TISSUE FORMATION AND SCATTERED CHRONIC INFLAMMATION. - UNDERLYING BENIGN BREAST PARENCHYMA WITH FIBROCYSTIC CHANGES. - NO DYSPLASIA, ATYPIA OR MALIGNANCY IDENTIFIED.  RADIOGRAPHIC STUDIES: I have  personally reviewed the radiological images as listed and agreed with the findings in the report.   Screening Mammogram 02/10/17  IMPRESSION: There is no mammographic evidence of malignancy. Routine mammographic evaluation in 1 year is recommended.    MR Breast Bilateral 05/10/2016 IMPRESSION: 1. Right mastectomy with no recurrence. 2. No MRI evidence of malignancy in the left breast.  Mr Breast Bilateral W Wo Contrast 02/10/2015    FINDINGS: Breast composition: d. Extreme fibroglandular tissue.  Background parenchymal enhancement: Marked  Right breast: There is asymmetric non mass enhancement involving the majority of the lower, outer quadrant of the right breast when compared to the left. This non mass enhancement is difficult to precisely measure given the marked background enhancement. However, the non mass enhancement appears to span at least 3.4 (AP) x 5.0 (TR) x 5.2 (CC) cm. Biopsy marker artifact corresponding to the known site of DCIS is seen within the inferolateral aspect of this enhancement in the lower, outer right breast, series 5, image 115.  Left breast: No dominant mass or suspicious enhancement. There is marked background parenchymal enhancement.  Lymph nodes: No abnormal appearing lymph nodes.  Ancillary findings:  None.   IMPRESSION: Asymmetric, non mass enhancement involves the majority of the lower, outer quadrant of the right breast spanning at least 3.4 x 5.0 x 5.2 cm. This non mass enhancement appears to correspond to the extent of calcifications seen  mammographically. If breast conservation is being considered, additional stereotactic biopsy of the right breast may be warranted for further evaluation of extent of disease.  RECOMMENDATION: Treatment planning.  BI-RADS CATEGORY  6: Known biopsy-proven malignancy.   Electronically Signed   By: Pamelia Hoit M.D.   On: 02/10/2015 13:24    ASSESSMENT AND PLAN : 52 y.o. Caucasian female with screening detected right breast cancer  1. 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 -She has been tolerating tamoxifen well since starting in 03/28/15, mild hot flash, no other side effects, we'll continue. -Continue breast cancer surveillance with annual mammogram, screening breast MRI can be considered if her insurance coverage, but not standard recommendation (her dense breast tissue (category C))  -I previously reviewed the breast MRI with the patient, which showed no evidence of recurrent recurrence. This was ordered by Dr. Lucia Gaskins as a baseline for future comparison if needed. The patient wishes to continue screening MRI every other year -I have encouraged her to continue healthy diet and exercise. -Her 03/22/17 estradiol and FSH levels are still in perimenopause range. I discussed if she is tolerating Tamoxifen well even when she becomes menopausal she can continue Tamoxifen. If intolerance arises, she can switch to an aromatase inhibitor.  -She is clinically doing well. Lab reviewed, she has stable anemia but otherwise CBC and CMP are within normal limits. Her physical exam and her 01/2017 mammogram were unremarkable. There is no clinical concern for recurrence. -Continue Tamoxifen  -Next Mammogram and Breast MRI in 01/2018 and 04/2018 -F/u in 6 months, then yearly after  that. She in interim she will see her GYN.   2. Genetics -Her genetic testing was negative for inheritable breast/ovarian syndrome.  3. Anemia, chronic  -She has had mild anemia since 02/2015 when she was diagnosed with breast cancer, and prior history of anemia in young age  -  She has not had her period since 06/2015.  -Previous labs reviewed, her iron studies, F68 and folic acid level are within normal limits, hemoglobin 11.4, she is mildly anemic. -Her hemoglobin electrophoresis is pending from today. -Hg stable at 11.5 today (06/16/17)  Plan:  -Continue Tamoxifen, refilled 90 day supply today with 3 refills  -Lab and f/u in 6 months. She will see me yearly after next visit  --Next Mammogram and Breast MRI in 01/2018 and 04/2018  All questions were answered. The patient knows to call the clinic with any problems, questions or concerns.  I spent 15 minutes counseling the patient face to face. The total time spent in the appointment was 20 minutes and more than 50% was on counseling.   Carolyn Merle, MD 06/16/2017   This document serves as a record of services personally performed by Carolyn Merle, MD. It was created on her behalf by Joslyn Devon, a trained medical scribe. The creation of this record is based on the scribe's personal observations and the provider's statements to them.    I have reviewed the above documentation for accuracy and completeness, and I agree with the above.

## 2017-06-16 NOTE — Telephone Encounter (Signed)
Scheduled appt per 1/24 los - patient is aware of appt date and time - no calender wanted.

## 2017-06-20 LAB — HEMOGLOBINOPATHY EVALUATION
HGB A: 97.8 % (ref 96.4–98.8)
HGB C: 0 %
HGB S QUANTITAION: 0 %
HGB VARIANT: 0 %
Hgb A2 Quant: 2.2 % (ref 1.8–3.2)
Hgb F Quant: 0 % (ref 0.0–2.0)

## 2017-08-11 IMAGING — MR MR BILATERAL BREAST WITHOUT AND WITH CONTRAST
8 of 12 series · 33 of 48 positions shown · IV contrast (11ml multihance)
Comparison: Previous exam(s).

CLINICAL DATA: Right mastectomy.  Annual breast MRI.

LABS:  None available
EXAM:
BILATERAL BREAST MRI WITH AND WITHOUT CONTRAST
TECHNIQUE: Multiplanar, multisequence MR images of both breasts were obtained
prior to and following the intravenous administration of 11 ml of
MultiHance.

[Series 2: t2_tirm_tra ipat (a-p) · axial · 3.0mm · 0.66mm/px · 1 of 55 slices shown]
[im 1/55]
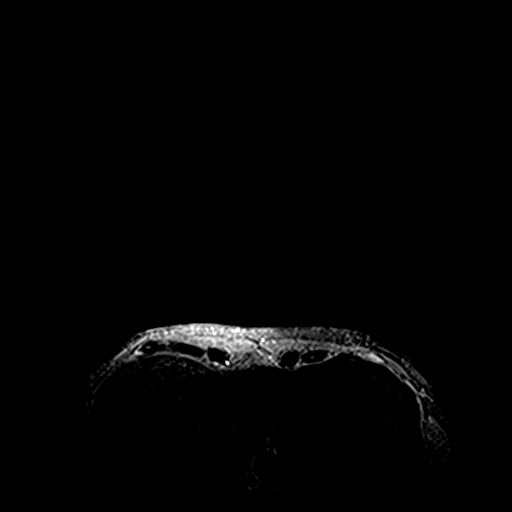

[Series 3: fl3d pre-cm no · axial · non-contrast · 1.2mm · 0.89mm/px · z∈[-84,+88]mm · 5 of 144 slices shown]
[im 1/144]
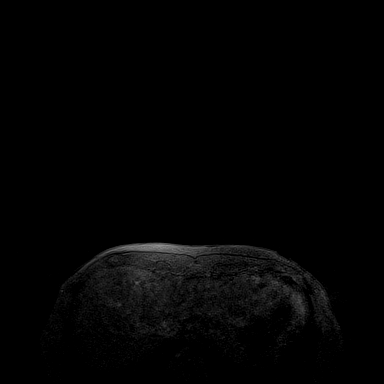
[im 36/144]
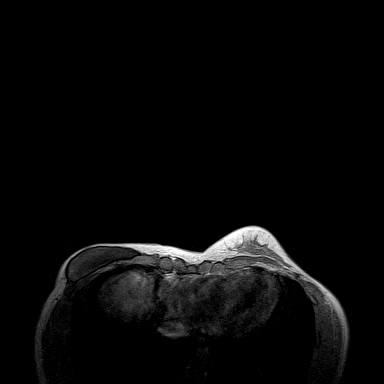
[im 72/144]
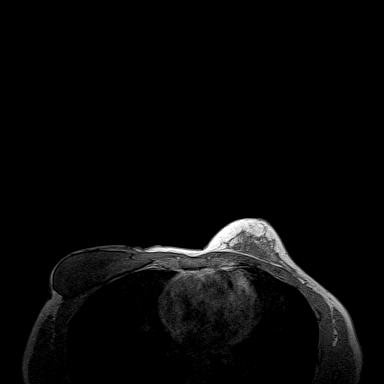
[im 108/144]
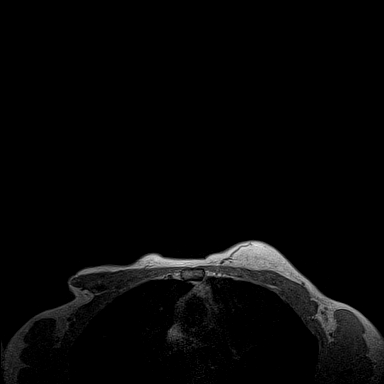
[im 144/144]
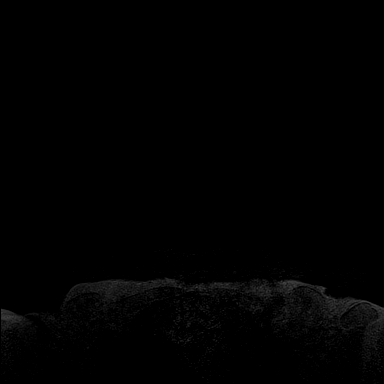

[Series 4: fl3d pre-cm · axial · non-contrast · 1.2mm · 0.89mm/px · z∈[-84,+88]mm · 5 of 144 slices shown]
[im 1/144]
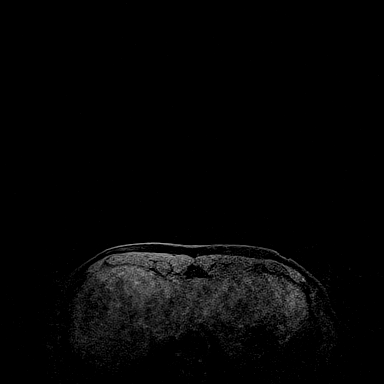
[im 36/144]
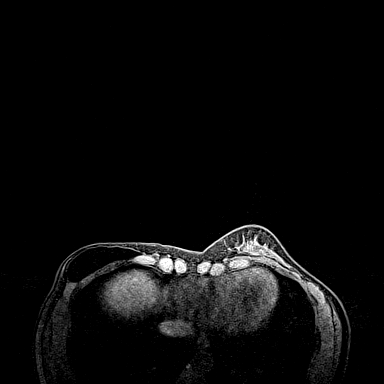
[im 72/144]
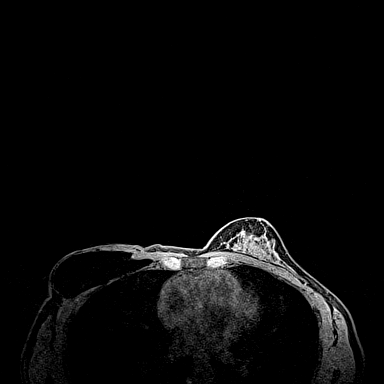
[im 108/144]
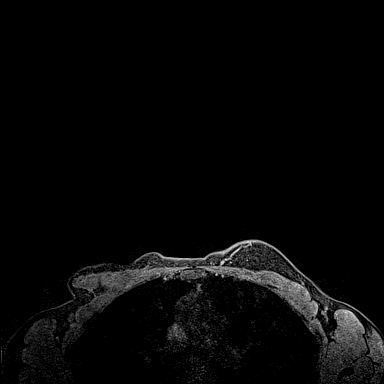
[im 144/144]
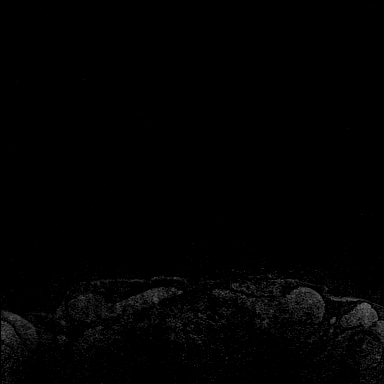

[Series 6: fl3d post immediate · axial · 1.2mm · 0.89mm/px · z∈[-84,+88]mm · 5 of 144 slices shown (1 of 3)]
[im 1/144]
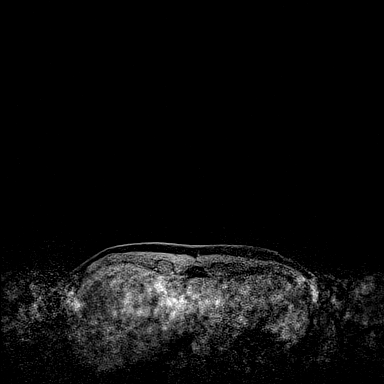
[im 36/144]
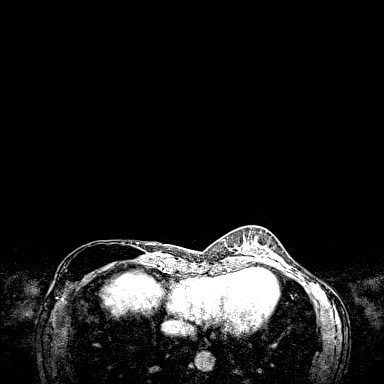
[im 72/144]
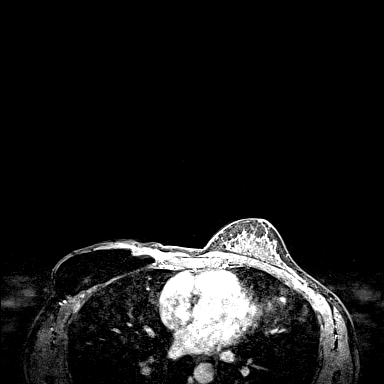
[im 108/144]
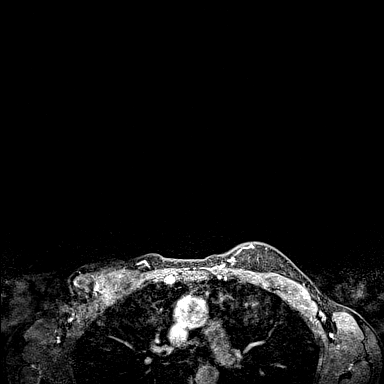
[im 144/144]
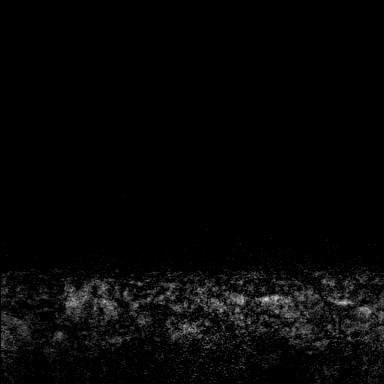

[Series 7: fl3d post immediate · axial · 1.2mm · 0.89mm/px · z∈[-84,+88]mm · 5 of 144 slices shown (2 of 3)]
[im 1/144]
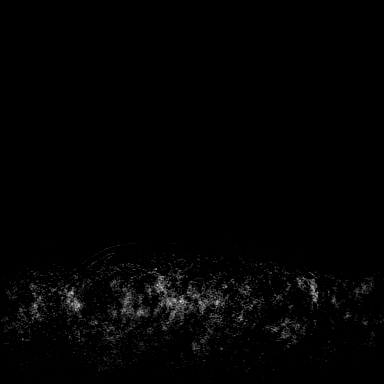
[im 36/144]
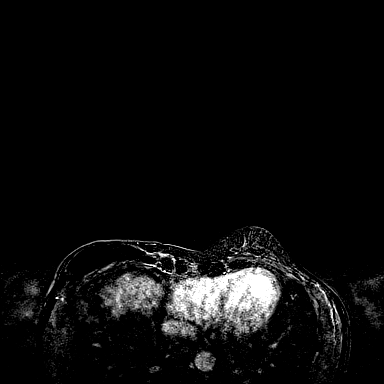
[im 72/144]
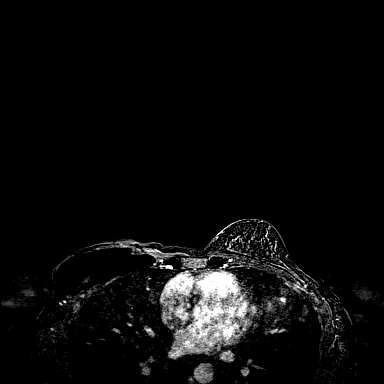
[im 108/144]
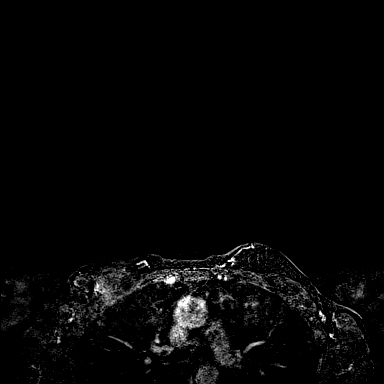
[im 144/144]
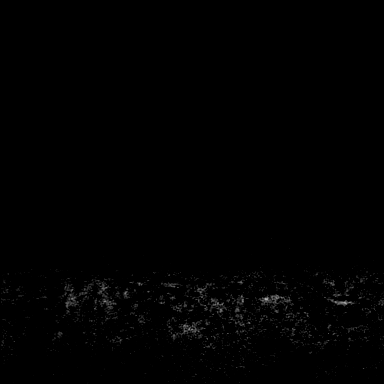

[Series 8: fl3d post immediate · axial · 172.8mm · 0.89mm/px · 1 of 1 slices shown (3 of 3)]
[im 1/1]
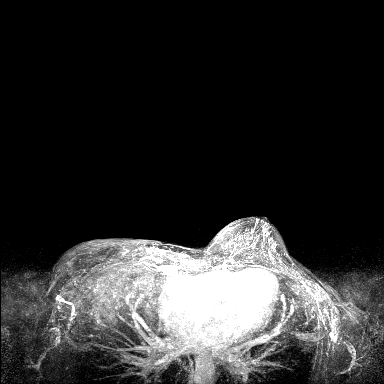

[Series 9: fl3d post 3min · axial · 1.2mm · 0.89mm/px · z∈[-84,+88]mm · 6 of 144 slices shown]
[im 1/144]
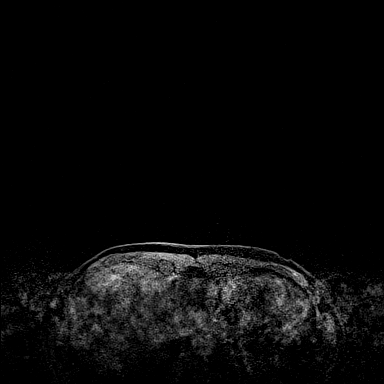
[im 29/144]
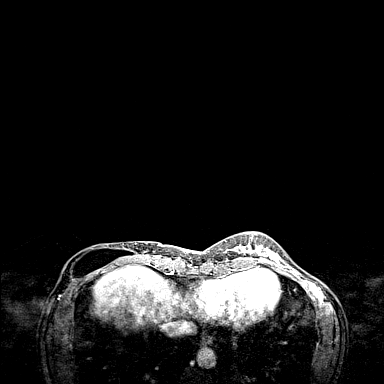
[im 58/144]
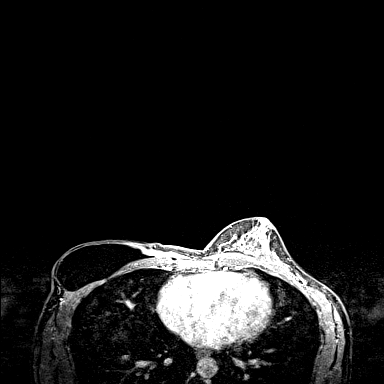
[im 86/144]
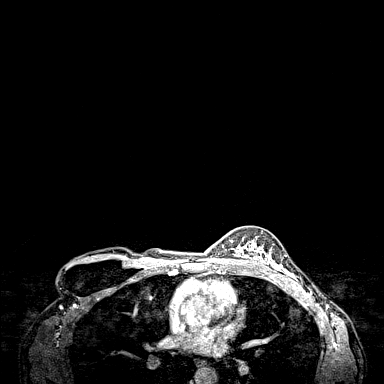
[im 115/144]
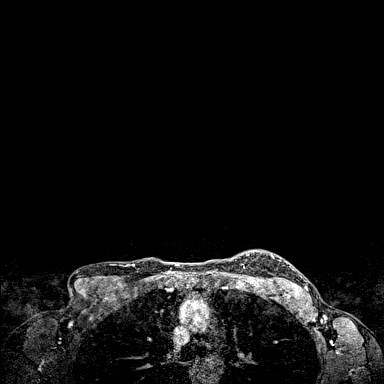
[im 144/144]
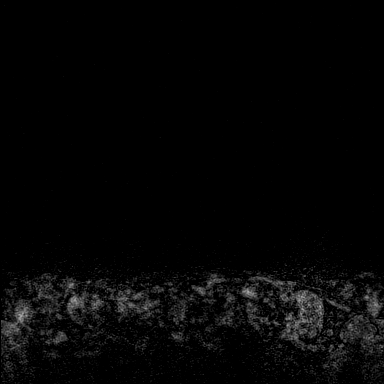

[Series 10: fl3d post 3min_sub · axial · 1.2mm · 0.89mm/px · z∈[-84,+53]mm · 5 of 144 slices shown]
[im 1/144]
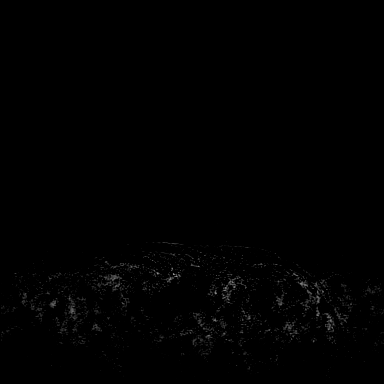
[im 29/144]
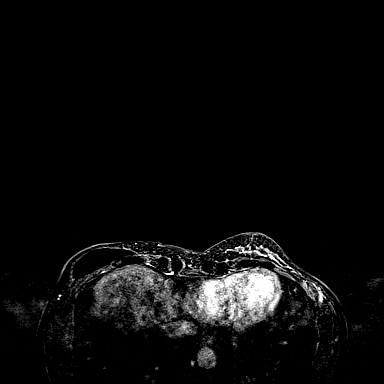
[im 58/144]
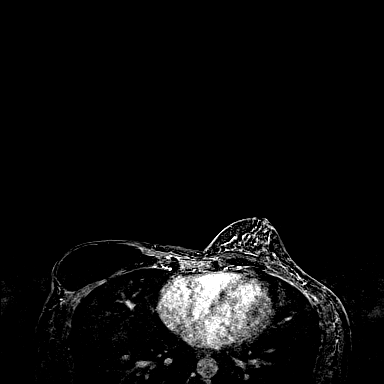
[im 86/144]
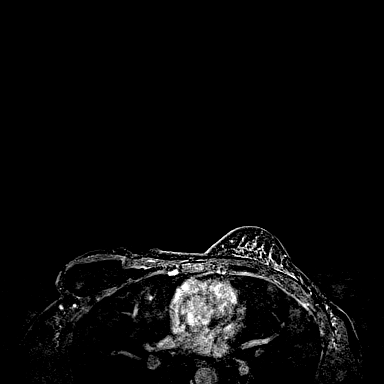
[im 115/144]
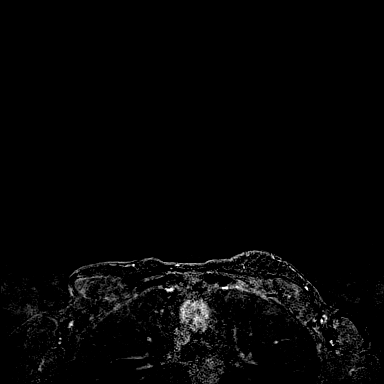

[33 of 48 positions shown; findings below may reference images not displayed]

THREE-DIMENSIONAL MR IMAGE RENDERING ON INDEPENDENT WORKSTATION:

Three-dimensional MR images were rendered by post-processing of the
original MR data on an independent workstation. The
three-dimensional MR images were interpreted, and findings are
reported in the following complete MRI report for this study. Three
dimensional images were evaluated at the independent [employer]
workstation
FINDINGS: Breast composition: Right mastectomy. Heterogeneous fibroglandular
tissue on the left.

Background parenchymal enhancement: Moderate on the left

Right breast: The patient is status post right mastectomy with no
evidence of recurrence. A retropectoral implant appears to be
intact.

Left breast: No suspicious masses or non mass enhancement.

Lymph nodes: No abnormal appearing lymph nodes.

Ancillary findings:  None.
IMPRESSION: 1. Right mastectomy with no recurrence.
2. No MRI evidence of malignancy in the left breast.

RECOMMENDATION:
Annual diagnostic mammography.

BI-RADS CATEGORY  2: Benign.

## 2017-12-09 ENCOUNTER — Inpatient Hospital Stay: Payer: BC Managed Care – PPO | Admitting: Hematology

## 2017-12-09 ENCOUNTER — Telehealth: Payer: Self-pay | Admitting: Hematology

## 2017-12-09 ENCOUNTER — Inpatient Hospital Stay: Payer: BC Managed Care – PPO | Attending: Hematology

## 2017-12-09 VITALS — BP 114/66 | HR 87 | Temp 98.1°F | Resp 18 | Ht 65.0 in | Wt 126.6 lb

## 2017-12-09 DIAGNOSIS — D0511 Intraductal carcinoma in situ of right breast: Secondary | ICD-10-CM | POA: Insufficient documentation

## 2017-12-09 DIAGNOSIS — Z170 Estrogen receptor positive status [ER+]: Secondary | ICD-10-CM

## 2017-12-09 DIAGNOSIS — D649 Anemia, unspecified: Secondary | ICD-10-CM | POA: Diagnosis not present

## 2017-12-09 DIAGNOSIS — Z9011 Acquired absence of right breast and nipple: Secondary | ICD-10-CM | POA: Diagnosis not present

## 2017-12-09 DIAGNOSIS — C50311 Malignant neoplasm of lower-inner quadrant of right female breast: Secondary | ICD-10-CM

## 2017-12-09 DIAGNOSIS — Z85828 Personal history of other malignant neoplasm of skin: Secondary | ICD-10-CM | POA: Insufficient documentation

## 2017-12-09 DIAGNOSIS — Z79810 Long term (current) use of selective estrogen receptor modulators (SERMs): Secondary | ICD-10-CM | POA: Insufficient documentation

## 2017-12-09 LAB — CBC WITH DIFFERENTIAL/PLATELET
Basophils Absolute: 0 10*3/uL (ref 0.0–0.1)
Basophils Relative: 0 %
EOS ABS: 0 10*3/uL (ref 0.0–0.5)
EOS PCT: 1 %
HCT: 35 % (ref 34.8–46.6)
Hemoglobin: 11.7 g/dL (ref 11.6–15.9)
LYMPHS ABS: 1.5 10*3/uL (ref 0.9–3.3)
Lymphocytes Relative: 38 %
MCH: 30.5 pg (ref 25.1–34.0)
MCHC: 33.4 g/dL (ref 31.5–36.0)
MCV: 91.4 fL (ref 79.5–101.0)
MONO ABS: 0.3 10*3/uL (ref 0.1–0.9)
Monocytes Relative: 7 %
Neutro Abs: 2.1 10*3/uL (ref 1.5–6.5)
Neutrophils Relative %: 54 %
PLATELETS: 240 10*3/uL (ref 145–400)
RBC: 3.83 MIL/uL (ref 3.70–5.45)
RDW: 13 % (ref 11.2–14.5)
WBC: 3.9 10*3/uL (ref 3.9–10.3)

## 2017-12-09 LAB — COMPREHENSIVE METABOLIC PANEL
ALT: 16 U/L (ref 0–44)
AST: 22 U/L (ref 15–41)
Albumin: 3.9 g/dL (ref 3.5–5.0)
Alkaline Phosphatase: 53 U/L (ref 38–126)
Anion gap: 7 (ref 5–15)
BUN: 15 mg/dL (ref 6–20)
CHLORIDE: 104 mmol/L (ref 98–111)
CO2: 30 mmol/L (ref 22–32)
CREATININE: 0.9 mg/dL (ref 0.44–1.00)
Calcium: 9.1 mg/dL (ref 8.9–10.3)
GFR calc Af Amer: >60 mL/min (ref >60)
GLUCOSE: 138 mg/dL — AB (ref 70–99)
Potassium: 3.6 mmol/L (ref 3.5–5.1)
Sodium: 141 mmol/L (ref 135–145)
Total Bilirubin: 0.3 mg/dL (ref 0.3–1.2)
Total Protein: 7.1 g/dL (ref 6.5–8.1)

## 2017-12-09 MED ORDER — TAMOXIFEN CITRATE 20 MG PO TABS: 20.0000 mg | ORAL_TABLET | Freq: Every day | ORAL | 3 refills | Status: DC

## 2017-12-09 NOTE — Telephone Encounter (Signed)
Scheduled appt per 7/19 los - pt aware - my chart active no print out wanted.

## 2017-12-09 NOTE — Progress Notes (Signed)
[place]  Telephone:([phone] Fax:(336) [phone]  Clinic follow up Note   Patient Care Team: [place] [place] [facility] as PCP - General Alphonsa Overall, MD as Consulting Physician (General Surgery) Truitt Merle, MD as Consulting Physician (Hematology) Thea Silversmith, MD as Consulting Physician (Radiation Oncology) Mauro Kaufmann, RN as Registered Nurse Rockwell Germany, RN as Registered Nurse Jake Shark, Johny Blamer, NP as Nurse Practitioner (Nurse Practitioner) Marla Roe, Loel Lofty, DO as Consulting Physician (Plastic Surgery) Megan Salon, MD as Consulting Physician (Gynecology)   Date of Service:  12/09/2017   CHIEF COMPLAINTS: Follow up right breast cancer   Oncology History   Breast cancer of lower-inner quadrant of right female breast [facility])   Staging form: Breast, AJCC 7th Edition     Clinical stage from 02/12/2015: Stage 0 (Tis (DCIS), N0, M0) - Unsigned       Staging comments: Staged at breast conference on 9.21.16      Pathologic stage from 03/13/2015: Stage IA (T1a, N0, cM0) - Signed by Truitt Merle, MD on 03/26/2015        Breast cancer of lower-inner quadrant of right female breast ([place])   01/28/2015 Mammogram    Screening mammogram showed new regional heterogeneous calcifications in the right breast central to the nipple midline depth.       02/06/2015 Initial Biopsy    Right breast core needle biopsy showed DCIS with calcifications and necrosis.      02/07/2015 Breast MRI    Breast MRI showed non-mass enhancement involves majority of the lower, outer quadrant of the right breast spanning at least 3.4 x 5.0 x 5.2 cm. No other lesion or adenopathy.      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 margins were negative for invasive carcinoma, but DCIS was involved in anterior margin, and broadly <0.1cm on other margins - none to cut      03/13/2015 Pathologic Stage    Stage IA: T1a N0      03/28/2015 -  Anti-estrogen oral therapy    Tamoxifen 74m daily. Potential change to AI once post menopausal. Planned duration of therapy 10 years.      06/05/2015 Survivorship    Survivorship visit completed and copy of care plan given to patient.       HISTORY OF PRESENTING ILLNESS (02/11/2015):  Carolyn Berg 52y.o. female is here because of newly diagnosed right breast cancer. She presents to our multidisciplinary breast clinic to discuss further management. She is accompanied by her husband.  This was discovered by screening mammogram. She is very compliant with year screening mammogram. Her screening mammogram on 01/28/2015 showed a new regional heterogeneous calcification in the lower-outer quadrant of right breast. She underwent core needle biopsy on 02/07/2015, which showed DCIS.  She feels well overall, denies any symptoms, no pain, dyspnea, weight loss or night sweats. She is a housewife, very physically active, no significant past medical history, no family history of breast cancer. She is premenopausal, takes birth control pill.  CURRENT THERAPY: Tamoxifen 218mdaily, started on 03/28/2015  INTERIM HISTORY   [name] returns for follow-up for her right breast cancer. She was lasts seen by me 6 months ago. She presents to the clinic today by herself. She notes she is doing well. She is tolerating Tamoxifen well with no issues. She notes her last period was before starting Tamoxifen. She plans to see Gyn in 02/2018. She notes she is still active with palates and yoga and cardio and weight bearing classes. She denies SOB with exercise.  She notes she currently works at [employer].     MEDICAL HISTORY:  Past Medical History:  Diagnosis Date  . Anxiety    anticipation of mastectomy   . Basal cell carcinoma of right thigh 05/2013  . Breast cancer of lower-inner quadrant of right female breast ([place]) 02/07/2015  . Fibroids     SURGICAL HISTORY: Past Surgical History:  Procedure Laterality Date  . BREAST RECONSTRUCTION WITH PLACEMENT OF TISSUE EXPANDER AND FLEX HD (ACELLULAR HYDRATED DERMIS) Right 03/13/2015  . BREAST RECONSTRUCTION WITH PLACEMENT OF TISSUE EXPANDER AND FLEX HD (ACELLULAR HYDRATED DERMIS) Right 03/13/2015   Procedure: IMMEDIATE RIGHT BREAST RECONSTRUCTION WITH PLACEMENT OF TISSUE EXPANDER AND FLEX PLIABLE 4X16 ;  Surgeon: Loel Lofty Dillingham, DO;  Location: [place];  Service: Plastics;  Laterality: Right;  . BREAST SURGERY Right 01/2015  . FINGER FRACTURE SURGERY Left    "middle finger"  . MASTECTOMY COMPLETE / SIMPLE W/ SENTINEL NODE BIOPSY Right 03/13/2015  . PARTIAL MASTECTOMY WITH AXILLARY SENTINEL LYMPH NODE BIOPSY Right 03/13/2015   Procedure: RIGHT MASTECTOMY WITH AXILLARY SENTINEL LYMPH NODE BIOPSY;  Surgeon: Alphonsa Overall, MD;  Location: [place];  Service: General;  Laterality: Right;  . REMOVAL OF TISSUE EXPANDER AND PLACEMENT OF IMPLANT Right 06/23/2015   Procedure: REMOVAL OF RIGHT BREAST TISSUE EXPANDER AND PLACEMENT OF SILICONE IMPLANT;  Surgeon: Wallace Going, DO;  Location: [place];  Service: Plastics;  Laterality: Right;  . SKIN BIOPSY Right 05/2013   basal cell on thigh  . VAGINAL DELIVERY     /w epidural   . WISDOM TOOTH EXTRACTION  early 2000's   /w IV sedation    GYN HISTORY  Menarchal:13 LMP: 06/2015 Contraceptive: 20+ years  HRT: N/A  G1P1: daughter is 59, no breast feeding, no plan for more children   SOCIAL HISTORY: Social History   Social History  . Marital Status: Married    Spouse Name: N/A  . Number of Children: 1 daughter 47 yo   . Years of Education:  N/A   Occupational History  . Not on file.   Social History Main Topics  . Smoking status: Never Smoker   . Smokeless tobacco: Never Used  . Alcohol Use: Yes     Comment: occ glass of wine  . Drug Use: No  . Sexual Activity:    Partners: Male    Birth Control/ Protection: Pill   Other Topics Concern  . Not on file   Social History Narrative    FAMILY HISTORY: Family History  Problem Relation Age of Onset  . Diabetes Paternal Grandfather   . Heart attack Paternal Grandfather   . Colon cancer Maternal Grandfather 50       lived to 55  . Renal Disease Maternal Grandfather   . Breast cancer Other        MGF's mother dx over 76  . Lung cancer Maternal Uncle   . Stroke Maternal Grandmother     ALLERGIES:  has No Known Allergies.  MEDICATIONS:  Current Outpatient Medications  Medication Sig Dispense Refill  . tamoxifen (NOLVADEX) 20 MG tablet Take 1 tablet (20 mg total) by mouth daily. 90 tablet 3   No current facility-administered medications for this visit.     REVIEW OF SYSTEMS:  Constitutional: Denies fevers, chills   Eyes: Denies blurriness of vision, double vision or watery eyes Ears, nose, mouth, throat, and face: Denies mucositis or sore throat Respiratory: Denies cough, dyspnea or wheezes Cardiovascular: Denies palpitation, chest discomfort or lower extremity swelling Gastrointestinal:  Denies nausea, heartburn or change in bowel habits Skin: Denies abnormal skin rashes Lymphatics: Denies new lymphadenopathy or easy bruising Neurological:Denies numbness, tingling or new weaknesses Behavioral/Psych: Mood is stable, no new changes  All other systems were reviewed with the patient and are negative.   PHYSICAL EXAMINATION: ECOG PERFORMANCE STATUS: 0  Vitals:   12/09/17 1417  BP: 114/66  Pulse: 87  Resp: 18  Temp: 98.1 F (36.7 C)  SpO2: 100%   Filed Weights   12/09/17 1417  Weight: 126 lb 9.6 oz (57.4 kg)    GENERAL:alert, no distress and  comfortable SKIN: skin color, texture, turgor are normal, no rashes or significant lesions EYES: normal, conjunctiva are pink and non-injected, sclera clear OROPHARYNX:no exudate, no erythema and lips, buccal mucosa, and tongue normal  NECK: supple, thyroid normal size, non-tender, without nodularity LYMPH:  no palpable lymphadenopathy in the cervical, axillary or inguinal LUNGS: clear to auscultation and percussion with normal breathing effort HEART: regular rate & rhythm and no lower extremity edema (+) Mild murmur ABDOMEN:abdomen soft, non-tender, and normal bowel sounds. No organomegaly. Musculoskeletal:no cyanosis of digits and no clubbing  PSYCH: alert & oriented x 3 with fluent speech NEURO: no focal motor/sensory deficits Breasts: s/p right mastectomy and implant placement: old surgical scar is well healed, no skin erythema. Palpation of left breast and axilla revealed no obvious masses that I could appreciate.  LABORATORY DATA:  I have reviewed the data as listed CBC Latest Ref Rng & Units 12/09/2017 06/16/2017 11/30/2016  WBC 3.9 - 10.3 K/uL 3.9 4.4 4.1  Hemoglobin 11.6 - 15.9 g/dL 11.7 11.5(L) 11.4(L)  Hematocrit 34.8 - 46.6 % 35.0 34.6(L) 33.9(L)  Platelets 145 - 400 K/uL 240 235 239    CMP Latest Ref Rng & Units 06/16/2017 11/30/2016 07/01/2016  Glucose 70 - 140 mg/dL 141(H) 114(H) 89  BUN 7 - 26 mg/dL 17 18 19.3  Creatinine 0.60 - 1.10 mg/dL 1.02 0.82 0.8  Sodium 136 - 145 mmol/L 140 138 142  Potassium 3.3 - 4.7 mmol/L 4.1 3.8 4.0  Chloride 98 - 109 mmol/L 105 104 -  CO2 22 - 29 mmol/L 27 28 27   Calcium 8.4 - 10.4 mg/dL 8.8 9.0 8.9  Total Protein 6.4 - 8.3 g/dL 6.6 7.3 6.8  Total Bilirubin 0.2 - 1.2 mg/dL 0.3 0.5 0.33  Alkaline Phos 40 - 150 U/L 45 38 49  AST 5 - 34 U/L 21 23 22   ALT 0 - 55 U/L 13 17 17     PATHOLOGY REPORT   Diagnosis 06/23/2015 Scar -, Scar tissue right breast - BENIGN SKIN WITH UNDERLYING SCAR TISSUE FORMATION AND SCATTERED CHRONIC INFLAMMATION. -  UNDERLYING BENIGN BREAST PARENCHYMA WITH FIBROCYSTIC CHANGES. - NO DYSPLASIA, ATYPIA OR MALIGNANCY IDENTIFIED.  RADIOGRAPHIC STUDIES: I have personally reviewed the radiological images as listed and agreed with the findings in the report.   Screening Mammogram 02/10/17  IMPRESSION: There is no mammographic evidence of malignancy. Routine mammographic evaluation in 1 year is recommended.  MR Breast Bilateral 05/10/2016 IMPRESSION: 1. Right mastectomy with no recurrence. 2. No MRI evidence of malignancy in the left breast.  Mr Breast Bilateral W Wo Contrast 02/10/2015    FINDINGS: Breast composition: d. Extreme fibroglandular tissue.  Background parenchymal enhancement: Marked  Right breast: There is asymmetric non mass enhancement involving the majority of the lower, outer quadrant of the right breast when compared to the left. This non mass enhancement is difficult to precisely measure given the marked background enhancement. However, the non mass enhancement appears to span at least 3.4 (AP) x 5.0 (TR) x 5.2 (CC) cm. Biopsy marker artifact corresponding to the known site of DCIS is seen within the inferolateral aspect of this enhancement in the lower, outer right breast, series 5, image 115.  Left breast: No dominant mass or suspicious enhancement. There is marked background parenchymal enhancement.  Lymph nodes: No abnormal appearing lymph nodes.  Ancillary findings:  None.   IMPRESSION: Asymmetric, non mass enhancement involves the majority of the lower, outer quadrant of the right breast spanning at least 3.4 x 5.0 x 5.2 cm. This non mass enhancement appears to correspond to the extent of calcifications seen mammographically. If breast conservation is being considered, additional stereotactic biopsy of the right breast may be warranted for further evaluation of extent of disease.  RECOMMENDATION: Treatment planning.  BI-RADS CATEGORY  6: Known biopsy-proven malignancy.   Electronically Signed    By: Pamelia Hoit M.D.   On: 02/10/2015 13:24    ASSESSMENT AND PLAN : 52 y.o. Caucasian female with screening detected right breast cancer  1. 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 She is tolerating tamoxifen well with mild hot flash, no other side effects, we'll continue. -Continue breast cancer surveillance with annual mammogram, screening breast MRI can be considered if her insurance coverage, but not standard recommendation (her dense breast tissue (category C))  -I previously reviewed the breast MRI with the patient, which showed no evidence of recurrent recurrence. This was ordered by Dr. Lucia Gaskins as a baseline for future comparison if needed. The patient wishes to continue screening MRI every other year -Her 03/22/17 estradiol and FSH levels are still in perimenopause range. Will continue Tamoxifen.  We previously discussed switching tamoxifen to aromatase inhibitor if she becomes postmenopausal.  She will recheck level with Gynecologist later this year  -She is clinically doing well. Lab reviewed,  CBC WNL and CMP still pending. Her physical exam and her 01/2017 mammogram were unremarkable. There is no clinical concern for recurrence. -She does have state insurance which can effect how she gets her MRI. Plan to have breast MRI in 07/2018 and repeat M[facility] in 01/2018 [facility]. -Continue Tamoxifen, refilled today  -F/u in 09/2018 and f/u with her Gyn and PCP in interim.    2. Genetics -Her genetic testing was negative for inheritable breast/ovarian syndrome.  3. Anemia, mild and chronic  -She has had mild anemia since 02/2015 when she was diagnosed with breast cancer, and prior history of anemia in young age  -She has not had  her period since 06/2015.  -Her hemoglobin electrophoresis is negative -currently resolved.  -She is 35, I encouraged her to have a screening colonoscopy.  Plan:  -Continue Tamoxifen, refilled today  -Mammogram in  01/2018 at [facility]  -Breast MRI in 07/2018 -Lab and f/u in 09/2018  -She will see her gynecologist in the winter 2019    All questions were answered. The patient knows to call the clinic with any problems, questions or concerns.  I spent 15 minutes counseling the patient face to face. The total time spent in the appointment was 20 minutes and more than 50% was on counseling.   Truitt Merle, MD 12/09/2017   I, Joslyn Devon, am acting as scribe for Truitt Merle, MD.   I have reviewed the above documentation for accuracy and completeness, and I agree with the above.

## 2017-12-10 ENCOUNTER — Encounter: Payer: Self-pay | Admitting: Hematology

## 2017-12-16 ENCOUNTER — Ambulatory Visit: Payer: BLUE CROSS/BLUE SHIELD | Admitting: Hematology

## 2017-12-16 ENCOUNTER — Other Ambulatory Visit: Payer: BLUE CROSS/BLUE SHIELD

## 2018-02-14 ENCOUNTER — Encounter: Payer: Self-pay | Admitting: Hematology

## 2018-02-28 ENCOUNTER — Ambulatory Visit
Admission: RE | Admit: 2018-02-28 | Discharge: 2018-02-28 | Disposition: A | Discharge status: Home / Self Care | Payer: BC Managed Care – PPO | Source: Ambulatory Visit | Attending: Hematology | Admitting: Hematology

## 2018-02-28 DIAGNOSIS — C50311 Malignant neoplasm of lower-inner quadrant of right female breast: Secondary | ICD-10-CM

## 2018-02-28 DIAGNOSIS — Z170 Estrogen receptor positive status [ER+]: Principal | ICD-10-CM

## 2018-02-28 MED ORDER — GADOBENATE DIMEGLUMINE 529 MG/ML IV SOLN
11.0000 mL | Freq: Once | INTRAVENOUS | Status: AC | PRN
  Administered 2018-02-28: 11 mL via INTRAVENOUS

## 2018-03-02 ENCOUNTER — Other Ambulatory Visit: Payer: Self-pay | Admitting: Hematology

## 2018-03-02 DIAGNOSIS — N6320 Unspecified lump in the left breast, unspecified quadrant: Secondary | ICD-10-CM

## 2018-03-03 ENCOUNTER — Telehealth: Payer: Self-pay | Admitting: Hematology

## 2018-03-03 NOTE — Telephone Encounter (Signed)
I called pt yesterday and reviewed her breast MRI findings, which showed a 1.2cm NME in UIQ of left breast, biopsy recommended by radiology and I agree. She voiced good understanding and agreed with the biopsy. It's scheduled for 10/18 now.   Carolyn Berg  03/03/2018

## 2018-03-08 ENCOUNTER — Encounter: Payer: Self-pay | Admitting: Obstetrics & Gynecology

## 2018-03-10 ENCOUNTER — Ambulatory Visit
Admission: RE | Admit: 2018-03-10 | Discharge: 2018-03-10 | Disposition: A | Discharge status: Home / Self Care | Payer: BC Managed Care – PPO | Source: Ambulatory Visit | Attending: Hematology | Admitting: Hematology

## 2018-03-10 DIAGNOSIS — N6320 Unspecified lump in the left breast, unspecified quadrant: Secondary | ICD-10-CM

## 2018-03-10 MED ORDER — GADOBENATE DIMEGLUMINE 529 MG/ML IV SOLN
11.0000 mL | Freq: Once | INTRAVENOUS | Status: AC | PRN
  Administered 2018-03-10: 11 mL via INTRAVENOUS

## 2018-03-30 NOTE — Progress Notes (Signed)
52 y.o. G1P1 Married Caucasian female here for annual exam.    Has a consult with her breast surgeon this month for a recheck of her left breast biopsy.  Still on Tamoxifen.  Has hot flashes during day and night.  Still able to sleep well.   Denies vaginal bleeding.   PCP:  None   Patient's last menstrual period was 07/09/2015.           Sexually active: Yes.   female The current method of family planning is post menopausal status.    Exercising: Yes.    weights, cardio and yoga Smoker:  no  Health Maintenance: Pap:  11/14/15 Pap and HR HPV negative             08/31/2013-WNL History of abnormal Pap:  no MMG: 02-17-18 3D Neg/density D/BiRads1---MRI guided BX of Lt.Br.--pathology revealed DUCTAL PAPILLOMA WITH USUAL DUCTAL HYPERPLASIA, FIBROCYSTIC CHANGES WITH CALCIFICATIONS, USUAL DUCTAL HYPERPLASIA AND SCLEROSING ADENOSIS, FOCAL FIBROADENOMATOID--Has appt. With Dr.Newman 03/2018. Colonoscopy: 04/2016 normal;next due 2027 BMD:   n/a  Result  n/a TDaP:  2010 Gardasil:   no HIV:Neg in pregnancy Hep C:not indicated Screening Labs:   --- Flu vaccine at work at [employer].    reports that she has never smoked. She has never used smokeless tobacco. She reports that she drinks alcohol. She reports that she does not use drugs.  Past Medical History:  Diagnosis Date  . Anxiety    anticipation of mastectomy   . Basal cell carcinoma of right thigh 05/2013  . Breast cancer of lower-inner quadrant of right female breast ([place]) 02/07/2015  . Fibroids     Past Surgical History:  Procedure Laterality Date  . BREAST RECONSTRUCTION WITH PLACEMENT OF TISSUE EXPANDER AND FLEX HD (ACELLULAR HYDRATED DERMIS) Right 03/13/2015  . BREAST RECONSTRUCTION WITH PLACEMENT OF TISSUE EXPANDER AND FLEX HD (ACELLULAR HYDRATED DERMIS) Right 03/13/2015   Procedure: IMMEDIATE RIGHT BREAST RECONSTRUCTION WITH PLACEMENT OF TISSUE EXPANDER AND FLEX PLIABLE 4X16 ;  Surgeon: Loel Lofty Dillingham, DO;  Location: [place];   Service: Plastics;  Laterality: Right;  . BREAST SURGERY Right 01/2015  . FINGER FRACTURE SURGERY Left    "middle finger"  . MASTECTOMY COMPLETE / SIMPLE W/ SENTINEL NODE BIOPSY Right 03/13/2015  . PARTIAL MASTECTOMY WITH AXILLARY SENTINEL LYMPH NODE BIOPSY Right 03/13/2015   Procedure: RIGHT MASTECTOMY WITH AXILLARY SENTINEL LYMPH NODE BIOPSY;  Surgeon: Alphonsa Overall, MD;  Location: [place];  Service: General;  Laterality: Right;  . REMOVAL OF TISSUE EXPANDER AND PLACEMENT OF IMPLANT Right 06/23/2015   Procedure: REMOVAL OF RIGHT BREAST TISSUE EXPANDER AND PLACEMENT OF SILICONE IMPLANT;  Surgeon: Wallace Going, DO;  Location: [place];  Service: Plastics;  Laterality: Right;  . SKIN BIOPSY Right 05/2013   basal cell on thigh  . VAGINAL DELIVERY     /w epidural   . WISDOM TOOTH EXTRACTION  early 2000's   /w IV sedation     Current Outpatient Medications  Medication Sig Dispense Refill  . tamoxifen (NOLVADEX) 20 MG tablet Take 1 tablet (20 mg total) by mouth daily. 90 tablet 3   No current facility-administered medications for this visit.     Family History  Problem Relation Age of Onset  . Diabetes Paternal Grandfather   . Heart attack Paternal Grandfather   . Colon cancer Maternal Grandfather 40       lived to 67  . Renal Disease Maternal Grandfather   . Breast cancer Other  MGF's mother dx over 74  . Lung cancer Maternal Uncle   . Stroke Maternal Grandmother     Review of Systems  All other systems reviewed and are negative.   Exam:   BP 122/64 (BP Location: Right Arm, Patient Position: Sitting, Cuff Size: Normal)   Pulse 76   Resp 16   Ht 5' 4.5" (1.638 m)   Wt 128 lb (58.1 kg)   LMP 07/09/2015   BMI 21.63 kg/m     General appearance: alert, cooperative and appears stated age Head: Normocephalic, without obvious abnormality, atraumatic Neck: no adenopathy, supple, symmetrical, trachea midline and thyroid normal to inspection and  palpation Lungs: clear to auscultation bilaterally Breasts: right breast absent with implant in place.  No axillary or supraclavicular adenopathy.  left breast with normal appearance, 3 mm superficial firmness at biopsy site, no tenderness, No nipple retraction or dimpling, No nipple discharge or bleeding, No axillary or supraclavicular adenopathy Heart: regular rate and rhythm Abdomen: soft, non-tender; no masses, no organomegaly Extremities: extremities normal, atraumatic, no cyanosis or edema Skin: Skin color, texture, turgor normal. No rashes or lesions Lymph nodes: Cervical, supraclavicular, and axillary nodes normal. No abnormal inguinal nodes palpated Neurologic: Grossly normal  Pelvic: External genitalia:  no lesions              Urethra:  normal appearing urethra with no masses, tenderness or lesions              Bartholins and Skenes: normal                 Vagina: normal appearing vagina with normal color and discharge, no lesions              Cervix: no lesions              Pap taken: Yes.   Bimanual Exam:  Uterus:  normal size, contour, position, consistency, mobility, non-tender              Adnexa: no mass, fullness, tenderness              Rectal exam: Yes.  .  Confirms.  Firm stool in the rectum.               Anus:  normal sphincter tone, no lesions  Chaperone was present for exam.  Assessment:   Well woman visit with normal exam. Status post right mastectomy with reconstruction.  Status post benign left breast biopsy.  Estrogen and progesterone positive breast cancer.  On Tamoxifen with menopausal symptoms. Genetic testing negative.   Plan: Mammogram screening and MRI per oncology.  Has follow up with her breast surgeon.  Recommended self breast awareness. Pap and HR HPV as above. Guidelines for Calcium, Vitamin D, regular exercise program including cardiovascular and weight bearing exercise. Cholesterol, FSH, and E2. Follow up annually and prn.   After  visit summary provided.

## 2018-03-31 ENCOUNTER — Other Ambulatory Visit: Payer: Self-pay

## 2018-03-31 ENCOUNTER — Encounter: Payer: Self-pay | Admitting: Obstetrics and Gynecology

## 2018-03-31 ENCOUNTER — Other Ambulatory Visit (HOSPITAL_COMMUNITY)
Admission: RE | Admit: 2018-03-31 | Discharge: 2018-03-31 | Disposition: A | Discharge status: Home / Self Care | Payer: BC Managed Care – PPO | Source: Ambulatory Visit | Attending: Obstetrics and Gynecology | Admitting: Obstetrics and Gynecology

## 2018-03-31 ENCOUNTER — Ambulatory Visit: Payer: BC Managed Care – PPO | Admitting: Obstetrics and Gynecology

## 2018-03-31 VITALS — BP 122/64 | HR 76 | Resp 16 | Ht 64.5 in | Wt 128.0 lb

## 2018-03-31 DIAGNOSIS — Z01419 Encounter for gynecological examination (general) (routine) without abnormal findings: Secondary | ICD-10-CM | POA: Diagnosis present

## 2018-03-31 DIAGNOSIS — N951 Menopausal and female climacteric states: Secondary | ICD-10-CM

## 2018-03-31 NOTE — Patient Instructions (Signed)

## 2018-04-01 LAB — LIPID PANEL
CHOLESTEROL TOTAL: 178 mg/dL (ref 100–199)
Chol/HDL Ratio: 2.1 ratio (ref 0.0–4.4)
HDL: 83 mg/dL (ref >39)
LDL CALC: 81 mg/dL (ref 0–99)
TRIGLYCERIDES: 68 mg/dL (ref 0–149)
VLDL Cholesterol Cal: 14 mg/dL (ref 5–40)

## 2018-04-01 LAB — ESTRADIOL: Estradiol: 12 pg/mL

## 2018-04-01 LAB — FOLLICLE STIMULATING HORMONE: FSH: 13.9 m[IU]/mL

## 2018-04-04 LAB — CYTOLOGY - PAP
Diagnosis: NEGATIVE
HPV: NOT DETECTED

## 2018-04-14 ENCOUNTER — Other Ambulatory Visit: Payer: Self-pay | Admitting: Surgery

## 2018-04-14 DIAGNOSIS — D242 Benign neoplasm of left breast: Secondary | ICD-10-CM

## 2018-05-15 NOTE — Pre-Procedure Instructions (Signed)
Carolyn Berg  05/15/2018      [id number] [place], [place] - [address] [place] [address] Phone: [phone] Fax: [phone]  [facility] [place] [place] [facility] [address] [place] [address] Phone: [phone] Fax: [phone]    Your procedure is scheduled on May 25, 2018.  Report to [facility] Admitting at 530 AM.  Call this number if you have problems the morning of surgery:  [phone]   Remember:  Do not eat after midnight.  You may drink clear liquids until 430 AM .  Clear liquids allowed are:    Water, Juice (non-citric and without pulp), Clear Tea, Black Coffee only and Gatorade    Take these medicines the morning of surgery with A SIP OF WATER-none  7 days prior to surgery STOP taking any Aspirin (unless otherwise instructed by your surgeon), Aleve, Naproxen, Ibuprofen, Motrin, Advil, Goody's, BC's, all herbal medications, fish oil, and all vitamins    Do not wear jewelry, make-up or nail polish.  Do not wear lotions, powders, or perfumes, or deodorant.  Do not shave 48 hours prior to surgery.  Do not bring valuables to the hospital.  [facility] is not responsible for any belongings or valuables.  Contacts, dentures or bridgework may not be worn into surgery.  Leave your suitcase in the car.  After surgery it may be brought to your room.  For patients admitted to the hospital, discharge time will be determined by your treatment team.  Patients discharged the day of surgery will not be allowed to drive home.    [facility] - Preparing For Surgery  Before surgery, you can play an important role. Because skin is not sterile, your skin needs to be as free of germs as possible. You can reduce the number of germs on your skin by washing with CHG (chlorahexidine gluconate) Soap before surgery.  CHG is an antiseptic cleaner which kills  germs and bonds with the skin to continue killing germs even after washing.    Oral Hygiene is also important to reduce your risk of infection.  Remember - BRUSH YOUR TEETH THE MORNING OF SURGERY WITH YOUR REGULAR TOOTHPASTE  Please do not use if you have an allergy to CHG or antibacterial soaps. If your skin becomes reddened/irritated stop using the CHG.  Do not shave (including legs and underarms) for at least 48 hours prior to first CHG shower. It is OK to shave your face.  Please follow these instructions carefully.   1. Shower the NIGHT BEFORE SURGERY and the MORNING OF SURGERY with CHG.   2. If you chose to wash your hair, wash your hair first as usual with your normal shampoo.  3. After you shampoo, rinse your hair and body thoroughly to remove the shampoo.  4. Use CHG as you would any other liquid soap. You can apply CHG directly to the skin and wash gently with a scrungie or a clean washcloth.   5. Apply the CHG Soap to your body ONLY FROM THE NECK DOWN.  Do not use on open wounds or open sores. Avoid contact with your eyes, ears, mouth and genitals (private parts). Wash Face and genitals (private parts)  with your normal soap.  6. Wash thoroughly, paying special attention to the area where your surgery will be performed.  7. Thoroughly rinse your body with warm water from the  neck down.  8. DO NOT shower/wash with your normal soap after using and rinsing off the CHG Soap.  9. Pat yourself dry with a CLEAN TOWEL.  10. Wear CLEAN PAJAMAS to bed the night before surgery, wear comfortable clothes the morning of surgery  11. Place CLEAN SHEETS on your bed the night of your first shower and DO NOT SLEEP WITH PETS.  Day of Surgery:  Do not apply any deodorants/lotions.  Please wear clean clothes to the hospital/surgery center.   Remember to brush your teeth WITH YOUR REGULAR TOOTHPASTE.   Please read over the following fact sheets that you were given.

## 2018-05-16 ENCOUNTER — Other Ambulatory Visit: Payer: Self-pay

## 2018-05-16 ENCOUNTER — Encounter (HOSPITAL_COMMUNITY)
Admission: RE | Admit: 2018-05-16 | Discharge: 2018-05-16 | Disposition: A | Discharge status: Home / Self Care | Payer: BC Managed Care – PPO | Source: Ambulatory Visit | Attending: Surgery | Admitting: Surgery

## 2018-05-16 ENCOUNTER — Encounter (HOSPITAL_COMMUNITY): Payer: Self-pay

## 2018-05-16 DIAGNOSIS — Z01812 Encounter for preprocedural laboratory examination: Secondary | ICD-10-CM | POA: Insufficient documentation

## 2018-05-16 LAB — CBC
HCT: 37.4 % (ref 36.0–46.0)
Hemoglobin: 12 g/dL (ref 12.0–15.0)
MCH: 29.7 pg (ref 26.0–34.0)
MCHC: 32.1 g/dL (ref 30.0–36.0)
MCV: 92.6 fL (ref 80.0–100.0)
PLATELETS: 260 10*3/uL (ref 150–400)
RBC: 4.04 MIL/uL (ref 3.87–5.11)
RDW: 12.8 % (ref 11.5–15.5)
WBC: 3.9 10*3/uL — ABNORMAL LOW (ref 4.0–10.5)
nRBC: 0 % (ref 0.0–0.2)

## 2018-05-16 LAB — BASIC METABOLIC PANEL
Anion gap: 8 (ref 5–15)
BUN: 17 mg/dL (ref 6–20)
CO2: 26 mmol/L (ref 22–32)
CREATININE: 1.03 mg/dL — AB (ref 0.44–1.00)
Calcium: 9.2 mg/dL (ref 8.9–10.3)
Chloride: 107 mmol/L (ref 98–111)
GFR calc Af Amer: >60 mL/min (ref >60)
GFR calc non Af Amer: >60 mL/min (ref >60)
GLUCOSE: 99 mg/dL (ref 70–99)
POTASSIUM: 4.5 mmol/L (ref 3.5–5.1)
SODIUM: 141 mmol/L (ref 135–145)

## 2018-05-16 NOTE — Progress Notes (Signed)
PCP - Edwinna Areola MD  Chest x-ray - N/A EKG - N/A  Blood Thinner Instructions: N/A Aspirin Instructions:N/A  Anesthesia review: none  Patient denies shortness of breath, fever, cough and chest pain at PAT appointment   Patient verbalized understanding of instructions that were given to them at the PAT appointment. Patient was also instructed that they will need to review over the PAT instructions again at home before surgery.

## 2018-05-23 ENCOUNTER — Ambulatory Visit
Admission: RE | Admit: 2018-05-23 | Discharge: 2018-05-23 | Disposition: A | Discharge status: Home / Self Care | Payer: BC Managed Care – PPO | Source: Ambulatory Visit | Attending: Surgery | Admitting: Surgery

## 2018-05-23 DIAGNOSIS — D242 Benign neoplasm of left breast: Secondary | ICD-10-CM

## 2018-05-24 NOTE — H&P (Signed)
[place]  Location: [facility] Patient #: [id number] DOB: 10/03/1965 Married / Language: English / Race: White Female  History of Present Illness   The patient is a 53 year old female who presents with breast cancer.  Her PCP is Dr. Mady Haagensen.   Multidisciplinary Breast cancer clinic. Her oncologist are Dr. Burr Medico and Dr. Pablo Ledger.  Sees Dr. Ammie Ferrier for GYN. I think that she leans on Dr. Sabra Heck for most of her care She comes by herself.   She went for routine follow-up evaluations and had an MRI. She had noticed no change in either breast. She continues to tamoxifen, and has some hot flashes, but is tolerating it well. She also talked about possibly taking the tamoxifen for 10 years. She had an MRI of her breast - 02/28/2018 - which showed a 1.2 x 0.9 x 0.9 cm non mass enhancement in the slightly upper inner left breast. She underwent a biopsy of her left breast on 03/10/2018 which shows a ductal papilloma, fibroscystic changes and ductal hyperplasia. I gave her a copy of her path report. The biopsy was felt to be concordant, but recommended excision.  I discussed the of proceeding with left bresat lumpectomy (seed localizaiton). The risks of surgery include, but are not limited to, bleeding, infection, the need for further surgery, and nerve injury. We talked some about cosmetics, since she is so small breasted. The patient has been given literature on the treatment of breast lumps.  Her daughter is doing well at [employer] in [place].  Past Medical History: 1. Right breast cancer Right mastectomy on 03/13/2015 ([id number]) - 0.4 cm of IDC, extensive DCIS, broadly less than 0.1 cm from anterior and posterior margins. 0/1 node.  She had a tissue expander. This was exchanged on 06/23/2015 by Dr. Marla Roe. She is doing well from that surgery. Alternating with Dr. Burr Medico. We have talked about MRI's at  1, 3, and 5 years. Then re-evaluate everything. On tamoxifen. 2. She gets biometrics through [place] at [facility]. 3. LMP Feb 2017.  Social History.  Married. Her husband is Herbie [place]. She has one daughter, 58 yo. She is at [employer] in [place] (graduates 2022) She is working at [employer] (?[employer]?) Her great grandmother had breast cancer.  Allergies (Tanisha A. Owens Shark, [place]; 04/14/2018 1:38 PM) No Known Drug Allergies [02/24/2015]: Allergies Reconciled   Medication History (Tanisha A. Owens Shark, [place]; 04/14/2018 1:38 PM) Tamoxifen Citrate (20MG  Tablet, Oral) Active. Medications Reconciled  Vitals (Tanisha A. Brown RMA; 04/14/2018 1:38 PM) 04/14/2018 1:37 PM Weight: 129.8 lb Height: 64in Body Surface Area: 1.63 m Body Mass Index: 22.28 kg/m  Temp.: 98.49F  Pulse: 95 (Regular)  BP: 124/72 (Sitting, Left Arm, Standard)  Physical Exam  GENERAL: Thin WN WF who is alert and healthy appearing. Has long hair. HEENT: Pupils equal. Dentition good. NECK: Supple. No thyroid mass.  LYMPH NODES: No cervical, supraclavicular, or axillary adenopathy.  BREASTS - RIGHT: s/p mastectomy with reconstruction. She has an implant. No nipple reconstruction. Very small breasted. No mass or nodule.  LEFT: Very small breasted. No palpable mass or nodule. No nipple discharge. She has a scar from the core biopsy at 1 o'clock.  Heart: RRR  UPPER EXTREMITIES: No evidence of lymphedema. She has good function of her right shoulder.   Assessment & Plan  1.  BREAST CANCER, STAGE 1, RIGHT (C50.911)  Story: Right breast biopsy on 02/06/2015 - accession: [id number] showed ductal carcinoma in situ. ER/PR reported as positive  Right mastectomy on  03/13/2015 ([id number]) - 0.4 cm of IDC, extensive DCIS, broadly less than 0.1 cm from anterior and posterior margins. 0/1 node.  On tamoxifen  Reconstruction by Dr. Migdalia Dk.  Oncology - Burr Medico and  Guy:   1) See back in 1 year (alternate with Dr. Burr Medico) for the routine breast follow up - will address left breast papilloma now.  2.  PAPILLOMA OF LEFT BREAST (D24.2)  Plan:   1) Plan excision of left breast papilloma/abnormality.  Alphonsa Overall, MD, [facility] Surgery Pager: [phone] Office phone:  [phone]

## 2018-05-25 ENCOUNTER — Ambulatory Visit (HOSPITAL_COMMUNITY)
Admission: RE | Admit: 2018-05-25 | Discharge: 2018-05-25 | Disposition: A | Discharge status: Home / Self Care | Payer: BC Managed Care – PPO | Attending: Surgery | Admitting: Surgery

## 2018-05-25 ENCOUNTER — Encounter (HOSPITAL_COMMUNITY)
Admission: RE | Disposition: A | Discharge status: Home / Self Care | Payer: Self-pay | Source: Home / Self Care | Attending: Surgery

## 2018-05-25 ENCOUNTER — Ambulatory Visit
Admission: RE | Admit: 2018-05-25 | Discharge: 2018-05-25 | Disposition: A | Discharge status: Home / Self Care | Payer: BC Managed Care – PPO | Source: Ambulatory Visit | Attending: Surgery | Admitting: Surgery

## 2018-05-25 ENCOUNTER — Ambulatory Visit (HOSPITAL_COMMUNITY): Payer: BC Managed Care – PPO | Admitting: Certified Registered Nurse Anesthetist

## 2018-05-25 ENCOUNTER — Ambulatory Visit (HOSPITAL_COMMUNITY): Payer: BC Managed Care – PPO | Admitting: Vascular Surgery

## 2018-05-25 ENCOUNTER — Other Ambulatory Visit: Payer: Self-pay

## 2018-05-25 ENCOUNTER — Encounter (HOSPITAL_COMMUNITY): Payer: Self-pay | Admitting: *Deleted

## 2018-05-25 DIAGNOSIS — Z79810 Long term (current) use of selective estrogen receptor modulators (SERMs): Secondary | ICD-10-CM | POA: Diagnosis not present

## 2018-05-25 DIAGNOSIS — Z853 Personal history of malignant neoplasm of breast: Secondary | ICD-10-CM | POA: Insufficient documentation

## 2018-05-25 DIAGNOSIS — D242 Benign neoplasm of left breast: Secondary | ICD-10-CM | POA: Insufficient documentation

## 2018-05-25 DIAGNOSIS — Z9011 Acquired absence of right breast and nipple: Secondary | ICD-10-CM | POA: Insufficient documentation

## 2018-05-25 HISTORY — PX: BREAST LUMPECTOMY WITH RADIOACTIVE SEED LOCALIZATION: SHX6424

## 2018-05-25 SURGERY — BREAST LUMPECTOMY WITH RADIOACTIVE SEED LOCALIZATION
Anesthesia: General | Site: Breast | Laterality: Left

## 2018-05-25 MED ORDER — HYDROMORPHONE HCL 1 MG/ML IJ SOLN: 0.2500 mg | INTRAMUSCULAR | Status: DC | PRN

## 2018-05-25 MED ORDER — MEPERIDINE HCL 50 MG/ML IJ SOLN: 6.2500 mg | INTRAMUSCULAR | Status: DC | PRN

## 2018-05-25 MED ORDER — PHENYLEPHRINE HCL 10 MG/ML IJ SOLN
INTRAMUSCULAR | Status: DC | PRN
  Administered 2018-05-25: 80 ug via INTRAVENOUS
  Administered 2018-05-25: 160 ug via INTRAVENOUS
  Administered 2018-05-25: 80 ug via INTRAVENOUS

## 2018-05-25 MED ORDER — CHLORHEXIDINE GLUCONATE CLOTH 2 % EX PADS: 6.0000 | MEDICATED_PAD | Freq: Once | CUTANEOUS | Status: DC

## 2018-05-25 MED ORDER — BUPIVACAINE-EPINEPHRINE (PF) 0.25% -1:200000 IJ SOLN
INTRAMUSCULAR | Status: AC
  Filled 2018-05-25: qty 30

## 2018-05-25 MED ORDER — MIDAZOLAM HCL 2 MG/2ML IJ SOLN
INTRAMUSCULAR | Status: AC
  Filled 2018-05-25: qty 2

## 2018-05-25 MED ORDER — 0.9 % SODIUM CHLORIDE (POUR BTL) OPTIME
TOPICAL | Status: DC | PRN
  Administered 2018-05-25: 1000 mL

## 2018-05-25 MED ORDER — MIDAZOLAM HCL 2 MG/2ML IJ SOLN
INTRAMUSCULAR | Status: DC | PRN
  Administered 2018-05-25: 2 mg via INTRAVENOUS

## 2018-05-25 MED ORDER — FENTANYL CITRATE (PF) 250 MCG/5ML IJ SOLN
INTRAMUSCULAR | Status: AC
  Filled 2018-05-25: qty 5

## 2018-05-25 MED ORDER — CEFAZOLIN SODIUM-DEXTROSE 2-4 GM/100ML-% IV SOLN
2.0000 g | INTRAVENOUS | Status: AC
  Administered 2018-05-25: 2 g via INTRAVENOUS

## 2018-05-25 MED ORDER — ONDANSETRON HCL 4 MG/2ML IJ SOLN
INTRAMUSCULAR | Status: DC | PRN
  Administered 2018-05-25: 4 mg via INTRAVENOUS

## 2018-05-25 MED ORDER — PROPOFOL 10 MG/ML IV BOLUS
INTRAVENOUS | Status: DC | PRN
  Administered 2018-05-25: 150 mg via INTRAVENOUS

## 2018-05-25 MED ORDER — ACETAMINOPHEN 500 MG PO TABS: 1000.0000 mg | ORAL_TABLET | ORAL | Status: DC

## 2018-05-25 MED ORDER — LIDOCAINE 2% (20 MG/ML) 5 ML SYRINGE
INTRAMUSCULAR | Status: DC | PRN
  Administered 2018-05-25: 100 mg via INTRAVENOUS

## 2018-05-25 MED ORDER — ONDANSETRON HCL 4 MG/2ML IJ SOLN: 4.0000 mg | Freq: Once | INTRAMUSCULAR | Status: DC | PRN

## 2018-05-25 MED ORDER — EPHEDRINE SULFATE 50 MG/ML IJ SOLN
INTRAMUSCULAR | Status: DC | PRN
  Administered 2018-05-25 (×2): 5 mg via INTRAVENOUS

## 2018-05-25 MED ORDER — BUPIVACAINE-EPINEPHRINE 0.25% -1:200000 IJ SOLN
INTRAMUSCULAR | Status: DC | PRN
  Administered 2018-05-25: 30 mL

## 2018-05-25 MED ORDER — GABAPENTIN 300 MG PO CAPS
300.0000 mg | ORAL_CAPSULE | ORAL | Status: AC
  Administered 2018-05-25: 300 mg via ORAL

## 2018-05-25 MED ORDER — ONDANSETRON HCL 4 MG/2ML IJ SOLN
INTRAMUSCULAR | Status: AC
  Filled 2018-05-25: qty 2

## 2018-05-25 MED ORDER — PROPOFOL 10 MG/ML IV BOLUS
INTRAVENOUS | Status: AC
  Filled 2018-05-25: qty 20

## 2018-05-25 MED ORDER — CEFAZOLIN SODIUM-DEXTROSE 2-4 GM/100ML-% IV SOLN
INTRAVENOUS | Status: AC
  Filled 2018-05-25: qty 100

## 2018-05-25 MED ORDER — LACTATED RINGERS IV SOLN
INTRAVENOUS | Status: DC | PRN
  Administered 2018-05-25 (×2): via INTRAVENOUS

## 2018-05-25 MED ORDER — FENTANYL CITRATE (PF) 250 MCG/5ML IJ SOLN
INTRAMUSCULAR | Status: DC | PRN
  Administered 2018-05-25: 50 ug via INTRAVENOUS

## 2018-05-25 MED ORDER — TRAMADOL HCL 50 MG PO TABS: 50.0000 mg | ORAL_TABLET | Freq: Four times a day (QID) | ORAL | 1 refills | Status: DC | PRN

## 2018-05-25 MED ORDER — ACETAMINOPHEN 500 MG PO TABS
ORAL_TABLET | ORAL | Status: AC
  Administered 2018-05-25: 1000 mg via ORAL
  Filled 2018-05-25: qty 2

## 2018-05-25 MED ORDER — GABAPENTIN 300 MG PO CAPS
ORAL_CAPSULE | ORAL | Status: AC
  Filled 2018-05-25: qty 1

## 2018-05-25 SURGICAL SUPPLY — 47 items
ADH SKN CLS APL DERMABOND .7 (GAUZE/BANDAGES/DRESSINGS) ×1
BINDER BREAST LRG (GAUZE/BANDAGES/DRESSINGS) ×1 IMPLANT
BINDER BREAST XLRG (GAUZE/BANDAGES/DRESSINGS) IMPLANT
BLADE SURG 15 STRL LF DISP TIS (BLADE) ×1 IMPLANT
BLADE SURG 15 STRL SS (BLADE) ×2
CANISTER SUCT 3000ML PPV (MISCELLANEOUS) IMPLANT
CHLORAPREP W/TINT 26ML (MISCELLANEOUS) ×2 IMPLANT
CLIP VESOCCLUDE SM WIDE 6/CT (CLIP) ×2 IMPLANT
COVER PROBE W GEL 5X96 (DRAPES) ×2 IMPLANT
COVER SURGICAL LIGHT HANDLE (MISCELLANEOUS) ×2 IMPLANT
COVER WAND RF STERILE (DRAPES) ×2 IMPLANT
DECANTER SPIKE VIAL GLASS SM (MISCELLANEOUS) ×2 IMPLANT
DERMABOND ADVANCED (GAUZE/BANDAGES/DRESSINGS) ×1
DERMABOND ADVANCED .7 DNX12 (GAUZE/BANDAGES/DRESSINGS) ×1 IMPLANT
DEVICE DUBIN SPECIMEN MAMMOGRA (MISCELLANEOUS) ×2 IMPLANT
DRAPE CHEST BREAST 15X10 FENES (DRAPES) ×2 IMPLANT
DRAPE UTILITY XL STRL (DRAPES) ×2 IMPLANT
DRSG PAD ABDOMINAL 8X10 ST (GAUZE/BANDAGES/DRESSINGS) ×2 IMPLANT
ELECT COATED BLADE 2.86 ST (ELECTRODE) ×2 IMPLANT
ELECT REM PT RETURN 9FT ADLT (ELECTROSURGICAL) ×2
ELECTRODE REM PT RTRN 9FT ADLT (ELECTROSURGICAL) ×1 IMPLANT
GAUZE SPONGE 4X4 12PLY STRL (GAUZE/BANDAGES/DRESSINGS) ×2 IMPLANT
GLOVE BIOGEL PI IND STRL 7.0 (GLOVE) IMPLANT
GLOVE BIOGEL PI INDICATOR 7.0 (GLOVE) ×1
GLOVE SURG SIGNA 7.5 PF LTX (GLOVE) ×2 IMPLANT
GOWN STRL REUS W/ TWL LRG LVL3 (GOWN DISPOSABLE) ×1 IMPLANT
GOWN STRL REUS W/ TWL XL LVL3 (GOWN DISPOSABLE) ×1 IMPLANT
GOWN STRL REUS W/TWL LRG LVL3 (GOWN DISPOSABLE) ×2
GOWN STRL REUS W/TWL XL LVL3 (GOWN DISPOSABLE) ×2
ILLUMINATOR WAVEGUIDE N/F (MISCELLANEOUS) IMPLANT
KIT BASIN OR (CUSTOM PROCEDURE TRAY) ×2 IMPLANT
KIT MARKER MARGIN INK (KITS) ×2 IMPLANT
LIGHT WAVEGUIDE WIDE FLAT (MISCELLANEOUS) IMPLANT
NDL HYPO 25GX1X1/2 BEV (NEEDLE) ×1 IMPLANT
NEEDLE HYPO 25GX1X1/2 BEV (NEEDLE) ×2 IMPLANT
NS IRRIG 1000ML POUR BTL (IV SOLUTION) IMPLANT
PACK SURGICAL SETUP 50X90 (CUSTOM PROCEDURE TRAY) ×2 IMPLANT
PENCIL BUTTON HOLSTER BLD 10FT (ELECTRODE) ×2 IMPLANT
SPONGE LAP 18X18 X RAY DECT (DISPOSABLE) ×2 IMPLANT
SUT MNCRL AB 4-0 PS2 18 (SUTURE) ×2 IMPLANT
SUT VIC AB 3-0 SH 18 (SUTURE) ×2 IMPLANT
SYR BULB 3OZ (MISCELLANEOUS) ×2 IMPLANT
SYR CONTROL 10ML LL (SYRINGE) ×2 IMPLANT
TOWEL OR 17X24 6PK STRL BLUE (TOWEL DISPOSABLE) ×2 IMPLANT
TOWEL OR 17X26 10 PK STRL BLUE (TOWEL DISPOSABLE) ×2 IMPLANT
TUBE CONNECTING 12X1/4 (SUCTIONS) IMPLANT
YANKAUER SUCT BULB TIP NO VENT (SUCTIONS) IMPLANT

## 2018-05-25 NOTE — Anesthesia Procedure Notes (Signed)
Procedure Name: LMA Insertion Date/Time: 05/25/2018 7:42 AM Performed by: Bryson Corona, CRNA Pre-anesthesia Checklist: Patient identified, Emergency Drugs available, Suction available and Patient being monitored Patient Re-evaluated:Patient Re-evaluated prior to induction Oxygen Delivery Method: Circle System Utilized Preoxygenation: Pre-oxygenation with 100% oxygen Induction Type: IV induction Ventilation: Mask ventilation without difficulty LMA: LMA inserted LMA Size: 4.0 Number of attempts: 1 Airway Equipment and Method: Bite block Placement Confirmation: positive ETCO2 Tube secured with: Tape Dental Injury: Teeth and Oropharynx as per pre-operative assessment

## 2018-05-25 NOTE — Interval H&P Note (Signed)
History and Physical Interval Note:  05/25/2018 7:18 AM  Carolyn Berg  has presented today for surgery, with the diagnosis of LEFT BREAST PAPILLOMA  The various methods of treatment have been discussed with the patient and family.  Daughter in room with patient.  Seed in place.  After consideration of risks, benefits and other options for treatment, the patient has consented to  Procedure(s): LEFT BREAST LUMPECTOMY WITH RADIOACTIVE SEED LOCALIZATION ERAS PATHWAY (Left) as a surgical intervention .  The patient's history has been reviewed, patient examined, no change in status, stable for surgery.  I have reviewed the patient's chart and labs.  Questions were answered to the patient's satisfaction.     Shann Medal

## 2018-05-25 NOTE — Discharge Instructions (Signed)
[facility] - DISCHARGE INSTRUCTIONS TO PATIENT  Activity:  Driving - may drive tomorrow, if doing well   Lifting - No lifting more than 15 pounds for 5 days, then no limit  Wound Care:   Leave wound dry for 2 days, then may shower  Diet:  As tolerated  Follow up appointment:  Call Dr. Pollie Friar office [facility]) at [phone] for an appointment in 2 to 3 weeks.  Medications and dosages:  Resume your home medications.  You have a prescription for:  Ultram  Call Dr. Lucia Gaskins or his office  [phone]) if you have:  Temperature greater than 100.4,  Persistent nausea and vomiting,  Severe uncontrolled pain,  Redness, tenderness, or signs of infection (pain, swelling, redness, odor or green/yellow discharge around the site),  Difficulty breathing, headache or visual disturbances,  Any other questions or concerns you may have after discharge.  In an emergency, call 911 or go to an Emergency Department at a nearby hospital.

## 2018-05-25 NOTE — Op Note (Signed)
05/25/2018  9:00 AM  PATIENT:  Carolyn Berg DOB: 04-18-1966 MRN: [id number]  PREOP DIAGNOSIS:   LEFT BREAST PAPILLOMA  POSTOP DIAGNOSIS:    Left breast papilloma, 1 o'clock position (subareolar)   PROCEDURE:   Procedure(s): LEFT BREAST LUMPECTOMY WITH RADIOACTIVE SEED LOCALIZATION   SURGEON:   Alphonsa Overall, M.D.  ANESTHESIA:   general  Anesthesiologist: Lillia Abed, MD CRNA: Valda Favia, CRNA; Elmer Sow H, CRNA  General  EBL:  minimal  ml  DRAINS:  none   LOCAL MEDICATIONS USED:   25 cc 1/4% marcaine  SPECIMEN:   Left breast lumpectomy (6 color paint)  COUNTS CORRECT:  YES  INDICATIONS FOR PROCEDURE:  Carolyn Berg is a 53 y.o. (DOB: 08/29/1965) white female whose primary care physician is Leighton Ruff, MD and comes for left breast lumpectomy.   She has a biopsy which shows a left breast papilloma.  She now comes for excision of this mass.    The indications and potential complications of surgery were explained to the patient. Potential complications include, but are not limited to, bleeding, infection, the need for further surgery, and nerve injury.     She had a I131 seed placed on 05/23/2018 in her left breast at [facility].  The seed is in the 1 o'clock position of the left breast.   OPERATIVE NOTE:   The patient was taken to operating room # 9 at [facility] where she underwent a general anesthesia  supervised by Anesthesiologist: Lillia Abed, MD CRNA: Valda Favia, CRNA; Elmer Sow H, CRNA. Her left breast was prepped with  ChloraPrep and sterilely draped.    A time-out and the surgical check list was reviewed.    The mass was about at the 1 o'clock position of the left breast, in a subareolar position.  I made an upper outer circumareolar incision.   I used the Neoprobe to identify the I131 seed.  I tried to excise an area around the tumor of at least 1 cm.    I excised this block of breast tissue approximately 2 cm by 3 cm  in  diameter.   I painted the lumpectomy specimen with the 6 color paint kit and did a specimen mammogram which confirmed the mass, clip, and the seed were all in the right position in the specimen.  The specimen was sent to pathology who called back to confirm that they have the seed and the specimen.   I then irrigated the wound with saline. I infiltrated approximately 25 mL of 1/4% Marcaine between the incisions.  I then closed the wound in layers using 3-0 Vicryl sutures for the deep layer. At the skin, I closed the incision with a 4-0 Monocryl suture. The incision was then painted with Dermabond.  She had gauze place over the wounds and placed in a breast binder.   The patient tolerated the procedure well, was transported to the recovery room in good condition. Sponge and needle count were correct at the end of the case.   Final pathology is pending.   Alphonsa Overall, MD, [facility] Surgery Pager: [phone] Office phone:  [phone]

## 2018-05-25 NOTE — Anesthesia Postprocedure Evaluation (Signed)
Anesthesia Post Note  Patient: Carolyn Berg  Procedure(s) Performed: LEFT BREAST LUMPECTOMY WITH RADIOACTIVE SEED LOCALIZATION ERAS PATHWAY (Left Breast)     Patient location during evaluation: PACU Anesthesia Type: General Level of consciousness: awake and alert Pain management: pain level controlled Vital Signs Assessment: post-procedure vital signs reviewed and stable Respiratory status: spontaneous breathing, nonlabored ventilation, respiratory function stable and patient connected to nasal cannula oxygen Cardiovascular status: blood pressure returned to baseline and stable Postop Assessment: no apparent nausea or vomiting Anesthetic complications: no    Last Vitals:  Vitals:   05/25/18 0908 05/25/18 0917  BP: 107/64 112/66  Pulse: 94 92  Resp: (!) 26 14  Temp: 36.6 C   SpO2: 98% 100%    Last Pain:  Vitals:   05/25/18 0917  TempSrc:   PainSc: 0-No pain                 Elisavet Buehrer DAVID

## 2018-05-25 NOTE — Anesthesia Preprocedure Evaluation (Signed)
Anesthesia Evaluation  Patient identified by MRN, date of birth, ID band Patient awake    Reviewed: Allergy & Precautions, NPO status , Patient's Chart, lab work & pertinent test results  Airway Mallampati: I  TM Distance: >3 FB Neck ROM: Full    Dental   Pulmonary    Pulmonary exam normal        Cardiovascular Normal cardiovascular exam     Neuro/Psych Anxiety    GI/Hepatic   Endo/Other    Renal/GU      Musculoskeletal   Abdominal   Peds  Hematology   Anesthesia Other Findings   Reproductive/Obstetrics                             Anesthesia Physical Anesthesia Plan  ASA: II  Anesthesia Plan: General   Post-op Pain Management:    Induction: Intravenous  PONV Risk Score and Plan: 3 and Ondansetron, Midazolam and Treatment may vary due to age or medical condition  Airway Management Planned: LMA  Additional Equipment:   Intra-op Plan:   Post-operative Plan: Extubation in OR  Informed Consent: I have reviewed the patients History and Physical, chart, labs and discussed the procedure including the risks, benefits and alternatives for the proposed anesthesia with the patient or authorized representative who has indicated his/her understanding and acceptance.     Plan Discussed with: CRNA and Surgeon  Anesthesia Plan Comments:         Anesthesia Quick Evaluation

## 2018-05-25 NOTE — Transfer of Care (Signed)
Immediate Anesthesia Transfer of Care Note  Patient: Carolyn Berg  Procedure(s) Performed: LEFT BREAST LUMPECTOMY WITH RADIOACTIVE SEED LOCALIZATION ERAS PATHWAY (Left Breast)  Patient Location: PACU  Anesthesia Type:General  Level of Consciousness: drowsy  Airway & Oxygen Therapy: Patient Spontanous Breathing and Patient connected to nasal cannula oxygen  Post-op Assessment: Report given to RN and Post -op Vital signs reviewed and stable  Post vital signs: Reviewed and stable  Last Vitals:  Vitals Value Taken Time  BP 111/59 05/25/2018  8:38 AM  Temp    Pulse 106 05/25/2018  8:39 AM  Resp 17 05/25/2018  8:39 AM  SpO2 100 % 05/25/2018  8:39 AM  Vitals shown include unvalidated device data.  Last Pain:  Vitals:   05/25/18 0605  TempSrc: Oral  PainSc:          Complications: No apparent anesthesia complications

## 2018-05-26 ENCOUNTER — Encounter (HOSPITAL_COMMUNITY): Payer: Self-pay | Admitting: Surgery

## 2018-06-02 ENCOUNTER — Encounter: Payer: Self-pay | Admitting: Plastic Surgery

## 2018-06-02 ENCOUNTER — Ambulatory Visit: Payer: BC Managed Care – PPO | Admitting: Plastic Surgery

## 2018-06-02 VITALS — BP 100/62 | HR 79 | Temp 98.0°F | Ht 64.5 in | Wt 130.5 lb

## 2018-06-02 DIAGNOSIS — Z9889 Other specified postprocedural states: Secondary | ICD-10-CM | POA: Diagnosis not present

## 2018-06-02 DIAGNOSIS — Z9011 Acquired absence of right breast and nipple: Secondary | ICD-10-CM | POA: Diagnosis not present

## 2018-06-05 ENCOUNTER — Encounter: Payer: Self-pay | Admitting: Plastic Surgery

## 2018-06-05 NOTE — Progress Notes (Signed)
Patient ID: Romeo Apple, female    DOB: 12/27/1965, 53 y.o.   MRN: [id number]   Chief Complaint  Patient presents with  . Follow-up    1 year-on (R) breast implant-pt states doing well-pt has last MRI in Oct  . Breast Problem    The patient is a 53 yrs old wf here for a yearly follow up on her breast reconstruction.  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  She underwent a right mastectomy with SLNB and reconstruction.  She has done very well with the reconstruction.  She had a new mass that was recently evaluated with MRI and biopsy and found to be a ductal papilloma. She worked with Dr. Lucia Gaskins and is going to watch it for now.  She is interested in NAC reconstruction.  Her implant is soft and she has very good symmetry.   Review of Systems  Constitutional: Negative for activity change and appetite change.  HENT: Negative.   Eyes: Negative.   Respiratory: Negative.  Negative for chest tightness.   Cardiovascular: Negative.   Gastrointestinal: Negative.  Negative for abdominal pain.  Endocrine: Negative.        Some night hot flashes  Genitourinary: Negative.   Musculoskeletal: Negative.   Skin: Negative for color change and wound.  Hematological: Negative.   Psychiatric/Behavioral: Negative.  Negative for behavioral problems.    Past Medical History:  Diagnosis Date  . Anxiety    anticipation of mastectomy   . Basal cell carcinoma of right thigh 05/2013  . Breast cancer of lower-inner quadrant of right female breast ([place]) 02/07/2015  . Fibroids     Past Surgical History:  Procedure Laterality Date  . BREAST LUMPECTOMY WITH RADIOACTIVE SEED LOCALIZATION Left 05/25/2018   Procedure: LEFT BREAST LUMPECTOMY WITH RADIOACTIVE SEED LOCALIZATION ERAS PATHWAY;  Surgeon: Alphonsa Overall, MD;  Location: [place];  Service: General;  Laterality: Left;  . BREAST RECONSTRUCTION WITH PLACEMENT OF TISSUE EXPANDER AND FLEX HD  (ACELLULAR HYDRATED DERMIS) Right 03/13/2015  . BREAST RECONSTRUCTION WITH PLACEMENT OF TISSUE EXPANDER AND FLEX HD (ACELLULAR HYDRATED DERMIS) Right 03/13/2015   Procedure: IMMEDIATE RIGHT BREAST RECONSTRUCTION WITH PLACEMENT OF TISSUE EXPANDER AND FLEX PLIABLE 4X16 ;  Surgeon: Loel Lofty Hatice Bubel, DO;  Location: [place];  Service: Plastics;  Laterality: Right;  . BREAST SURGERY Right 01/2015  . FINGER FRACTURE SURGERY Left    "middle finger"  . MASTECTOMY COMPLETE / SIMPLE W/ SENTINEL NODE BIOPSY Right 03/13/2015  . PARTIAL MASTECTOMY WITH AXILLARY SENTINEL LYMPH NODE BIOPSY Right 03/13/2015   Procedure: RIGHT MASTECTOMY WITH AXILLARY SENTINEL LYMPH NODE BIOPSY;  Surgeon: Alphonsa Overall, MD;  Location: [place];  Service: General;  Laterality: Right;  . REMOVAL OF TISSUE EXPANDER AND PLACEMENT OF IMPLANT Right 06/23/2015   Procedure: REMOVAL OF RIGHT BREAST TISSUE EXPANDER AND PLACEMENT OF SILICONE IMPLANT;  Surgeon: Wallace Going, DO;  Location: [place];  Service: Plastics;  Laterality: Right;  . SKIN BIOPSY Right 05/2013   basal cell on thigh  . VAGINAL DELIVERY     /w epidural   . WISDOM TOOTH EXTRACTION  early 2000's   /w IV sedation       Current Outpatient Medications:  .  tamoxifen (NOLVADEX) 20 MG tablet, Take 1 tablet (20 mg total) by mouth daily. (Patient taking differently: Take 20 mg by mouth every evening. ), Disp: 90 tablet, Rfl: 3   Objective:   Vitals:  06/02/18 1411  BP: 100/62  Pulse: 79  Temp: 98 F (36.7 C)  SpO2: 98%    Physical Exam Vitals signs and nursing note reviewed.  Constitutional:      Appearance: She is normal weight.  HENT:     Head: Normocephalic and atraumatic.     Nose: Nose normal.     Mouth/Throat:     Mouth: Mucous membranes are moist.  Cardiovascular:     Rate and Rhythm: Normal rate.  Pulmonary:     Effort: Pulmonary effort is normal.  Abdominal:     General: Abdomen is flat. There is no distension.      Tenderness: There is no abdominal tenderness.  Musculoskeletal:        General: No swelling or deformity.  Skin:    General: Skin is warm.  Neurological:     General: No focal deficit present.     Mental Status: She is alert.  Psychiatric:        Mood and Affect: Mood normal.        Thought Content: Thought content normal.        Judgment: Judgment normal.     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  Will plan for nipple areola reconstruction on the right. She was able to talk with [facility].  [place], DO

## 2018-09-07 ENCOUNTER — Encounter: Payer: Self-pay | Admitting: Hematology

## 2018-09-11 ENCOUNTER — Telehealth: Payer: Self-pay | Admitting: Hematology

## 2018-09-11 NOTE — Telephone Encounter (Signed)
Returned patient's phone call regarding rescheduling an appointment, due to concerns of COVID-19 per patient's request appointment has been moved from April to June.

## 2018-10-06 ENCOUNTER — Other Ambulatory Visit: Payer: BC Managed Care – PPO

## 2018-10-06 ENCOUNTER — Ambulatory Visit: Payer: BC Managed Care – PPO | Admitting: Hematology

## 2018-11-01 NOTE — Progress Notes (Signed)
Carolyn Berg   [phone] [phone]   Clinic Follow up Note   Patient Care Team: Patient, No Pcp Per as PCP - General (General Practice) Alphonsa Overall, MD as Consulting Physician (General Surgery) Truitt Merle, MD as Consulting Physician (Hematology) Thea Silversmith, MD as Consulting Physician (Radiation Oncology) Mauro Kaufmann, RN as Registered Nurse Rockwell Germany, RN as Registered Nurse Jake Shark, Johny Blamer, NP as Nurse Practitioner (Nurse Practitioner) Marla Roe, Loel Lofty, DO as Consulting Physician (Plastic Surgery) Megan Salon, MD as Consulting Physician (Gynecology)  Date of Service:  11/04/2018  CHIEF COMPLAINT: F/u of right breast cancer   SUMMARY OF ONCOLOGIC HISTORY: Oncology History Overview Note  Breast cancer of lower-inner quadrant of right female breast [facility])   Staging form: Breast, AJCC 7th Edition     Clinical stage from 02/12/2015: Stage 0 (Tis (DCIS), N0, M0) - Unsigned       Staging comments: Staged at breast conference on 9.21.16      Pathologic stage from 03/13/2015: Stage IA (T1a, N0, cM0) - Signed by Truitt Merle, MD on 03/26/2015      Breast cancer of lower-inner quadrant of right female breast ([place])  01/28/2015 Mammogram   Screening mammogram showed new regional heterogeneous calcifications in the right breast central to the nipple midline depth.    02/06/2015 Initial Biopsy   Right breast core needle biopsy showed DCIS with calcifications and necrosis.   02/07/2015 Breast MRI   Breast MRI showed non-mass enhancement involves majority of the lower, outer quadrant of the right breast spanning at least 3.4 x 5.0 x 5.2 cm. No other lesion or adenopathy.   02/07/2015 Clinical Stage   Stage 0: Tis N0   02/12/2015 Procedure   Breast/Ovarian Next panel reveals no clinically significant variant at ATM, BARD1, BRCA1, BRCA2, BRIP1, CDH1, CHEK2, EPCAM, FANCC, MLH1, MSH2, MSH6, NBN, PALB2, PMS2, PTEN, RAD51C, RAD51D, TP53,  and XRCC2   03/13/2015 Definitive Surgery   Right mastectomy/SLNB: invasive ductal carcinoma, 0.4cm, G2, ER/PR+, HER2neu neg, Ki67 10%, extensive DCIS, G2. margins were negative for invasive carcinoma, but DCIS was involved in anterior margin, and broadly <0.1cm on other margins - none to cut   03/13/2015 Pathologic Stage   Stage IA: T1a N0   03/28/2015 -  Anti-estrogen oral therapy   Tamoxifen 41m daily. Potential change to AI once post menopausal. Planned duration of therapy 10 years.   06/05/2015 Survivorship   Survivorship visit completed and copy of care plan given to patient.      CURRENT THERAPY:  Tamoxifen 213mdaily starting 03/28/15   INTERVAL HISTORY:  Carolyn Berg is here for a follow up of right breast cancer. She presents to the clinic alone. She notes she is doing well. She denies any new changes. She notes she is taking Tamoxifen. She still has hot flashes. She denies joint pain. She continues to exercise at home. She denies fatigue or SOB due to her mild anemia. She notes her last period was in early 2017.    REVIEW OF SYSTEMS:   Constitutional: Denies fevers, chills or abnormal weight loss (+) hot flashes  Eyes: Denies blurriness of vision Ears, nose, mouth, throat, and face: Denies mucositis or sore throat Respiratory: Denies cough, dyspnea or wheezes Cardiovascular: Denies palpitation, chest discomfort or lower extremity swelling Gastrointestinal:  Denies nausea, heartburn or change in bowel habits Skin: Denies abnormal skin rashes Lymphatics: Denies new lymphadenopathy or easy bruising Neurological:Denies numbness, tingling or new weaknesses Behavioral/Psych: Mood is stable,  no new changes  All other systems were reviewed with the patient and are negative.  MEDICAL HISTORY:  Past Medical History:  Diagnosis Date  . Anxiety    anticipation of mastectomy   . Basal cell carcinoma of right thigh 05/2013  . Breast cancer of lower-inner quadrant of right  female breast ([place]) 02/07/2015  . Fibroids     SURGICAL HISTORY: Past Surgical History:  Procedure Laterality Date  . BREAST LUMPECTOMY WITH RADIOACTIVE SEED LOCALIZATION Left 05/25/2018   Procedure: LEFT BREAST LUMPECTOMY WITH RADIOACTIVE SEED LOCALIZATION ERAS PATHWAY;  Surgeon: Alphonsa Overall, MD;  Location: [place];  Service: General;  Laterality: Left;  . BREAST RECONSTRUCTION WITH PLACEMENT OF TISSUE EXPANDER AND FLEX HD (ACELLULAR HYDRATED DERMIS) Right 03/13/2015  . BREAST RECONSTRUCTION WITH PLACEMENT OF TISSUE EXPANDER AND FLEX HD (ACELLULAR HYDRATED DERMIS) Right 03/13/2015   Procedure: IMMEDIATE RIGHT BREAST RECONSTRUCTION WITH PLACEMENT OF TISSUE EXPANDER AND FLEX PLIABLE 4X16 ;  Surgeon: Loel Lofty Dillingham, DO;  Location: [place];  Service: Plastics;  Laterality: Right;  . BREAST SURGERY Right 01/2015  . FINGER FRACTURE SURGERY Left    "middle finger"  . MASTECTOMY COMPLETE / SIMPLE W/ SENTINEL NODE BIOPSY Right 03/13/2015  . PARTIAL MASTECTOMY WITH AXILLARY SENTINEL LYMPH NODE BIOPSY Right 03/13/2015   Procedure: RIGHT MASTECTOMY WITH AXILLARY SENTINEL LYMPH NODE BIOPSY;  Surgeon: Alphonsa Overall, MD;  Location: [place];  Service: General;  Laterality: Right;  . REMOVAL OF TISSUE EXPANDER AND PLACEMENT OF IMPLANT Right 06/23/2015   Procedure: REMOVAL OF RIGHT BREAST TISSUE EXPANDER AND PLACEMENT OF SILICONE IMPLANT;  Surgeon: Wallace Going, DO;  Location: [place];  Service: Plastics;  Laterality: Right;  . SKIN BIOPSY Right 05/2013   basal cell on thigh  . VAGINAL DELIVERY     /w epidural   . WISDOM TOOTH EXTRACTION  early 2000's   /w IV sedation     I have reviewed the social history and family history with the patient and they are unchanged from previous note.  ALLERGIES:  has No Known Allergies.  MEDICATIONS:  Current Outpatient Medications  Medication Sig Dispense Refill  . tamoxifen (NOLVADEX) 20 MG tablet Take 1 tablet (20 mg total) by mouth daily. 90  tablet 3   No current facility-administered medications for this visit.     PHYSICAL EXAMINATION: ECOG PERFORMANCE STATUS: 0 - Asymptomatic  Vitals:   11/03/18 1349  BP: (!) 103/58  Pulse: 83  Resp: 18  Temp: 97.8 F (36.6 C)  SpO2: 100%   Filed Weights   11/03/18 1349  Weight: 128 lb 9.6 oz (58.3 kg)    GENERAL:alert, no distress and comfortable SKIN: skin color, texture, turgor are normal, no rashes or significant lesions EYES: normal, Conjunctiva are pink and non-injected, sclera clear  NECK: supple, thyroid normal size, non-tender, without nodularity LYMPH:  no palpable lymphadenopathy in the cervical, axillary  LUNGS: clear to auscultation and percussion with normal breathing effort HEART: regular rate & rhythm and no murmurs and no lower extremity edema ABDOMEN:abdomen soft, non-tender and normal bowel sounds Musculoskeletal:no cyanosis of digits and no clubbing  NEURO: alert & oriented x 3 with fluent speech, no focal motor/sensory deficits BREAST: S/p right mastectomy and reconstruction: Surgical incisions healed (+) No palpable mass or adenopathy  LABORATORY DATA:  I have reviewed the data as listed CBC Latest Ref Rng & Units 11/03/2018 05/16/2018 12/09/2017  WBC 4.0 - 10.5 K/uL 4.7 3.9(L) 3.9  Hemoglobin 12.0 - 15.0 g/dL 11.0(L) 12.0 11.7  Hematocrit 36.0 - 46.0 % 33.8(L) 37.4 35.0  Platelets 150 - 400 K/uL 233 260 240     CMP Latest Ref Rng & Units 11/03/2018 05/16/2018 12/09/2017  Glucose 70 - 99 mg/dL 140(H) 99 138(H)  BUN 6 - 20 mg/dL 13 17 15   Creatinine 0.44 - 1.00 mg/dL 0.90 1.03(H) 0.90  Sodium 135 - 145 mmol/L 140 141 141  Potassium 3.5 - 5.1 mmol/L 3.8 4.5 3.6  Chloride 98 - 111 mmol/L 104 107 104  CO2 22 - 32 mmol/L 27 26 30   Calcium 8.9 - 10.3 mg/dL 8.5(L) 9.2 9.1  Total Protein 6.5 - 8.1 g/dL 6.9 - 7.1  Total Bilirubin 0.3 - 1.2 mg/dL 0.4 - 0.3  Alkaline Phos 38 - 126 U/L 48 - 53  AST 15 - 41 U/L 22 - 22  ALT 0 - 44 U/L 17 - 16   MRI  Breast 02/28/18   Area of progressive non mass enhancement in the slightly upper inner left breast, anterior depth measures 1.2 x 0.9 x 0.9 cm Status post right mastectomy with intact subpectoral silicone implant reconstruction. No evidence of lymphadenopathy.   Diagnosis 03/11/19 Breast, left, needle core biopsy, upper inner; retroareolar - DUCTAL PAPILLOMA WITH USUAL DUCTAL HYPERPLASIA. - FIBROCYSTIC CHANGES WITH CALCIFICATIONS, USUAL DUCTAL HYPERPLASIA AND SCLEROSING ADENOSIS. - FOCAL FIBROADENOMATOID CHANGE. - SEE MICROSCOPIC DESCRIPTION   Diagnosis 05/25/18 Breast, lumpectomy, Left w/ Radioactive Seed - DUCTAL PAPILLOMA WITH USUAL DUCTAL HYPERPLASIA. - FIBROCYSTIC CHANGES WITH USUAL DUCTAL HYPERPLASIA AND CALCIFICATIONS. - FOCAL FIBROADENOMATOID CHANGE. - PREVIOUS BIOPSY SITE AND BIOPSY CLIP. - NO EVIDENCE OF MALIGNANCY.   RADIOGRAPHIC STUDIES: I have personally reviewed the radiological images as listed and agreed with the findings in the report. No results found.   ASSESSMENT & PLAN:  Carolyn Berg is a 53 y.o. female with   1. 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 She is s/p right mastectomy with reconstruction in 02/2015 -She is on adjuvant tamoxifen since 03/28/15, planning for total of 5-10 years. She is tolerating tamoxifen well with mild hot flash, no other side effects, we'll continue. -Her 02/2018 breast MRI showed hyperenhancing mass, 03/10/18 biopsy showed usual ductal hyperplasia. She underwent right breast lumpectomy to remove this on 05/25/18, no evidence of malignancy.  -She is clinically doing well. Lab reviewed, her CBC and CMP are within normal limits except Hg 11, BG 140, Ca 8.5. Her physical exam was unremarkable. There is no clinical concern for recurrence. -Continue surveillance. Next mamm[facility]  at 02/22/[facility]  -Her last period was in early 2017. Her 03/2018 hormonal test showed her still perimenopausal.  -Continue Tamoxifen.  -F/u in 1 year and f/u with Dr Lucia Gaskins in 6 months     2. Genetic testing was negative    3. Anemia, mild and chronic  -She has had mild anemia since 02/2015 when she was diagnosed with breast cancer, and prior history of anemia in young age. She has not had her period since 06/2015.  -Her hemoglobin electrophoresis is negative -She is 51, I encouraged her to have a screening colonoscopy. -Hg at 11 today (11/03/18). I encouraged her to take multivitamin daily.   Plan:  -Continue Tamoxifen, refilled today  -M[facility] on 02/22/19 at [facility] and f/u in 1 year    No problem-specific Assessment & Plan notes found for this encounter.  No orders of the defined types were placed in this encounter.  All questions were answered. The patient knows to call the clinic with any problems, questions or concerns. No barriers to learning was detected. I spent 15 minutes counseling the patient face to face. The total time spent in the appointment was 20 minutes and more than 50% was on counseling and review of test results     Truitt Merle, MD 11/04/2018   I, Joslyn Devon, am acting as scribe for Truitt Merle, MD.   I have reviewed the above documentation for accuracy and completeness, and I agree with the above.

## 2018-11-03 ENCOUNTER — Encounter: Payer: Self-pay | Admitting: Hematology

## 2018-11-03 ENCOUNTER — Other Ambulatory Visit: Payer: Self-pay

## 2018-11-03 ENCOUNTER — Inpatient Hospital Stay: Payer: BC Managed Care – PPO | Attending: Hematology | Admitting: Hematology

## 2018-11-03 ENCOUNTER — Inpatient Hospital Stay: Payer: BC Managed Care – PPO

## 2018-11-03 VITALS — BP 103/58 | HR 83 | Temp 97.8°F | Resp 18 | Ht 64.0 in | Wt 128.6 lb

## 2018-11-03 DIAGNOSIS — Z170 Estrogen receptor positive status [ER+]: Secondary | ICD-10-CM | POA: Insufficient documentation

## 2018-11-03 DIAGNOSIS — D649 Anemia, unspecified: Secondary | ICD-10-CM | POA: Insufficient documentation

## 2018-11-03 DIAGNOSIS — Z79810 Long term (current) use of selective estrogen receptor modulators (SERMs): Secondary | ICD-10-CM

## 2018-11-03 DIAGNOSIS — Z9011 Acquired absence of right breast and nipple: Secondary | ICD-10-CM | POA: Diagnosis not present

## 2018-11-03 DIAGNOSIS — C50311 Malignant neoplasm of lower-inner quadrant of right female breast: Secondary | ICD-10-CM | POA: Insufficient documentation

## 2018-11-03 LAB — COMPREHENSIVE METABOLIC PANEL
ALT: 17 U/L (ref 0–44)
AST: 22 U/L (ref 15–41)
Albumin: 3.7 g/dL (ref 3.5–5.0)
Alkaline Phosphatase: 48 U/L (ref 38–126)
Anion gap: 9 (ref 5–15)
BUN: 13 mg/dL (ref 6–20)
CO2: 27 mmol/L (ref 22–32)
Calcium: 8.5 mg/dL — ABNORMAL LOW (ref 8.9–10.3)
Chloride: 104 mmol/L (ref 98–111)
Creatinine, Ser: 0.9 mg/dL (ref 0.44–1.00)
GFR calc Af Amer: >60 mL/min (ref >60)
GFR calc non Af Amer: >60 mL/min (ref >60)
Glucose, Bld: 140 mg/dL — ABNORMAL HIGH (ref 70–99)
Potassium: 3.8 mmol/L (ref 3.5–5.1)
Sodium: 140 mmol/L (ref 135–145)
Total Bilirubin: 0.4 mg/dL (ref 0.3–1.2)
Total Protein: 6.9 g/dL (ref 6.5–8.1)

## 2018-11-03 LAB — CBC WITH DIFFERENTIAL/PLATELET
Abs Immature Granulocytes: 0.02 10*3/uL (ref 0.00–0.07)
Basophils Absolute: 0 10*3/uL (ref 0.0–0.1)
Basophils Relative: 0 %
Eosinophils Absolute: 0 10*3/uL (ref 0.0–0.5)
Eosinophils Relative: 1 %
HCT: 33.8 % — ABNORMAL LOW (ref 36.0–46.0)
Hemoglobin: 11 g/dL — ABNORMAL LOW (ref 12.0–15.0)
Immature Granulocytes: 0 %
Lymphocytes Relative: 37 %
Lymphs Abs: 1.8 10*3/uL (ref 0.7–4.0)
MCH: 30.3 pg (ref 26.0–34.0)
MCHC: 32.5 g/dL (ref 30.0–36.0)
MCV: 93.1 fL (ref 80.0–100.0)
Monocytes Absolute: 0.2 10*3/uL (ref 0.1–1.0)
Monocytes Relative: 5 %
Neutro Abs: 2.6 10*3/uL (ref 1.7–7.7)
Neutrophils Relative %: 57 %
Platelets: 233 10*3/uL (ref 150–400)
RBC: 3.63 MIL/uL — ABNORMAL LOW (ref 3.87–5.11)
RDW: 13.2 % (ref 11.5–15.5)
WBC: 4.7 10*3/uL (ref 4.0–10.5)
nRBC: 0 % (ref 0.0–0.2)

## 2018-11-03 MED ORDER — TAMOXIFEN CITRATE 20 MG PO TABS: 20.0000 mg | ORAL_TABLET | Freq: Every day | ORAL | 3 refills | Status: DC

## 2018-11-04 ENCOUNTER — Encounter: Payer: Self-pay | Admitting: Hematology

## 2018-11-06 ENCOUNTER — Telehealth: Payer: Self-pay | Admitting: Hematology

## 2018-11-06 NOTE — Telephone Encounter (Signed)
Scheduled appt per 6/12 los. A calendar will be mailed out.

## 2019-04-06 ENCOUNTER — Ambulatory Visit: Payer: BC Managed Care – PPO | Admitting: Obstetrics and Gynecology

## 2019-04-06 ENCOUNTER — Other Ambulatory Visit: Payer: Self-pay

## 2019-04-09 NOTE — Progress Notes (Signed)
53 y.o. G1P1 Married Caucasian female here for annual exam.    Having hot flashes.  On Tamoxifen.  Declines treatment for this.  Not feeling like it is affecting the quality of her life.   Daughter coming home for the Holidays.  PCP: None    Patient's last menstrual period was 07/09/2015.           Sexually active: Yes.    The current method of family planning is post menopausal status.    Exercising: Yes.    pilates, weights, cardio Smoker:  no  Health Maintenance: Pap:03-31-18 Neg:Neg HR HPV, 11-14-15 Neg:Neg HR HPV, 08-31-13 Neg History of abnormal Pap:  no MMG:02-22-19 3D/Neg/density D/BiRads1 Hx Rt.Br.Ca 2016 Colonoscopy: 04/2016 normal;next due 2027 BMD:   n/a  Result  n/a TDaP: 2010--would like today Gardasil:   no HIV:Neg in Preg Hep C: no Screening Labs:  Today.  Flu vaccine:  Completed.   reports that she has never smoked. She has never used smokeless tobacco. She reports current alcohol use. She reports that she does not use drugs.  Past Medical History:  Diagnosis Date  . Anxiety    anticipation of mastectomy   . Basal cell carcinoma of right thigh 05/2013  . Breast cancer of lower-inner quadrant of right female breast ([place]) 02/07/2015  . Fibroids     Past Surgical History:  Procedure Laterality Date  . BREAST LUMPECTOMY WITH RADIOACTIVE SEED LOCALIZATION Left 05/25/2018   Procedure: LEFT BREAST LUMPECTOMY WITH RADIOACTIVE SEED LOCALIZATION ERAS PATHWAY;  Surgeon: Alphonsa Overall, MD;  Location: [place];  Service: General;  Laterality: Left;  . BREAST RECONSTRUCTION WITH PLACEMENT OF TISSUE EXPANDER AND FLEX HD (ACELLULAR HYDRATED DERMIS) Right 03/13/2015  . BREAST RECONSTRUCTION WITH PLACEMENT OF TISSUE EXPANDER AND FLEX HD (ACELLULAR HYDRATED DERMIS) Right 03/13/2015   Procedure: IMMEDIATE RIGHT BREAST RECONSTRUCTION WITH PLACEMENT OF TISSUE EXPANDER AND FLEX PLIABLE 4X16 ;  Surgeon: Loel Lofty Dillingham, DO;  Location: [place];  Service: Plastics;  Laterality: Right;   . BREAST SURGERY Right 01/2015  . FINGER FRACTURE SURGERY Left    "middle finger"  . MASTECTOMY COMPLETE / SIMPLE W/ SENTINEL NODE BIOPSY Right 03/13/2015  . PARTIAL MASTECTOMY WITH AXILLARY SENTINEL LYMPH NODE BIOPSY Right 03/13/2015   Procedure: RIGHT MASTECTOMY WITH AXILLARY SENTINEL LYMPH NODE BIOPSY;  Surgeon: Alphonsa Overall, MD;  Location: [place];  Service: General;  Laterality: Right;  . REMOVAL OF TISSUE EXPANDER AND PLACEMENT OF IMPLANT Right 06/23/2015   Procedure: REMOVAL OF RIGHT BREAST TISSUE EXPANDER AND PLACEMENT OF SILICONE IMPLANT;  Surgeon: Wallace Going, DO;  Location: [place];  Service: Plastics;  Laterality: Right;  . SKIN BIOPSY Right 05/2013   basal cell on thigh  . VAGINAL DELIVERY     /w epidural   . WISDOM TOOTH EXTRACTION  early 2000's   /w IV sedation     Current Outpatient Medications  Medication Sig Dispense Refill  . Multiple Vitamin (MULTIVITAMIN) capsule Take 1 capsule by mouth daily.    . tamoxifen (NOLVADEX) 20 MG tablet Take 1 tablet (20 mg total) by mouth daily. 90 tablet 3   No current facility-administered medications for this visit.     Family History  Problem Relation Age of Onset  . Diabetes Paternal Grandfather   . Heart attack Paternal Grandfather   . Colon cancer Maternal Grandfather 3       lived to 39  . Renal Disease Maternal Grandfather   . Breast cancer Other  MGF's mother dx over 76  . Lung cancer Maternal Uncle   . Stroke Maternal Grandmother     Review of Systems  All other systems reviewed and are negative.   Exam:   BP 110/72   Pulse 96   Temp (!) 97.5 F (36.4 C) (Temporal)   Resp 20   Ht 5' 4.25" (1.632 m)   Wt 129 lb (58.5 kg)   LMP 07/09/2015   BMI 21.97 kg/m     General appearance: alert, cooperative and appears stated age Head: normocephalic, without obvious abnormality, atraumatic Neck: no adenopathy, supple, symmetrical, trachea midline and thyroid normal to inspection and  palpation Lungs: clear to auscultation bilaterally Breasts: left - scar noted around areola, normal appearance, no masses or tenderness, No nipple retraction or dimpling, No nipple discharge or bleeding, No axillary adenopathy Right breast absent with implant present.  No masses. No axillary adenopathy.  Heart: regular rate and rhythm Abdomen: soft, non-tender; no masses, no organomegaly Extremities: extremities normal, atraumatic, no cyanosis or edema Skin: skin color, texture, turgor normal. No rashes or lesions Lymph nodes: cervical, supraclavicular, and axillary nodes normal. Neurologic: grossly normal  Pelvic: External genitalia:  no lesions              No abnormal inguinal nodes palpated.              Urethra:  normal appearing urethra with no masses, tenderness or lesions              Bartholins and Skenes: normal                 Vagina: normal appearing vagina with normal color and discharge, no lesions              Cervix: no lesions              Pap taken: No. Bimanual Exam:  Uterus:  normal size, contour, position, consistency, mobility, non-tender              Adnexa: no mass, fullness, tenderness              Rectal exam: Yes.  .  Confirms.              Anus:  normal sphincter tone, no lesions  Chaperone was present for exam.  Assessment:   Well woman visit with normal exam. Status post right mastectomy with reconstruction.  Status post benign left breast lumpectomy.  Estrogen and progesterone positive breast cancer.  On Tamoxifen with menopausal symptoms. Genetic testing negative.  Elevated glucose.   Plan: Mammogram yearly. Self breast awareness reviewed. Pap and HR HPV as above. Guidelines for Calcium, Vitamin D, regular exercise program including cardiovascular and weight bearing exercise. Routine labs and A1C.  Tdap.   Follow up annually and prn.   After visit summary provided.

## 2019-04-10 ENCOUNTER — Other Ambulatory Visit: Payer: Self-pay

## 2019-04-10 ENCOUNTER — Ambulatory Visit: Payer: BC Managed Care – PPO | Admitting: Obstetrics and Gynecology

## 2019-04-10 ENCOUNTER — Encounter: Payer: Self-pay | Admitting: Obstetrics and Gynecology

## 2019-04-10 VITALS — BP 110/72 | HR 96 | Temp 97.5°F | Resp 20 | Ht 64.25 in | Wt 129.0 lb

## 2019-04-10 DIAGNOSIS — Z23 Encounter for immunization: Secondary | ICD-10-CM

## 2019-04-10 DIAGNOSIS — Z01419 Encounter for gynecological examination (general) (routine) without abnormal findings: Secondary | ICD-10-CM

## 2019-04-10 NOTE — Patient Instructions (Signed)

## 2019-04-11 LAB — COMPREHENSIVE METABOLIC PANEL
ALT: 18 IU/L (ref 0–32)
AST: 23 IU/L (ref 0–40)
Albumin/Globulin Ratio: 1.7 (ref 1.2–2.2)
Albumin: 4.1 g/dL (ref 3.8–4.9)
Alkaline Phosphatase: 51 IU/L (ref 39–117)
BUN/Creatinine Ratio: 21 (ref 9–23)
BUN: 17 mg/dL (ref 6–24)
Bilirubin Total: 0.2 mg/dL (ref 0.0–1.2)
CO2: 25 mmol/L (ref 20–29)
Calcium: 8.8 mg/dL (ref 8.7–10.2)
Chloride: 107 mmol/L — ABNORMAL HIGH (ref 96–106)
Creatinine, Ser: 0.8 mg/dL (ref 0.57–1.00)
GFR calc Af Amer: 97 mL/min/{1.73_m2} (ref >59)
GFR calc non Af Amer: 84 mL/min/{1.73_m2} (ref >59)
Globulin, Total: 2.4 g/dL (ref 1.5–4.5)
Glucose: 82 mg/dL (ref 65–99)
Potassium: 4.3 mmol/L (ref 3.5–5.2)
Sodium: 144 mmol/L (ref 134–144)
Total Protein: 6.5 g/dL (ref 6.0–8.5)

## 2019-04-11 LAB — LIPID PANEL
Chol/HDL Ratio: 2.2 ratio (ref 0.0–4.4)
Cholesterol, Total: 182 mg/dL (ref 100–199)
HDL: 81 mg/dL (ref >39)
LDL Chol Calc (NIH): 87 mg/dL (ref 0–99)
Triglycerides: 79 mg/dL (ref 0–149)
VLDL Cholesterol Cal: 14 mg/dL (ref 5–40)

## 2019-04-11 LAB — CBC WITH DIFFERENTIAL/PLATELET
Basophils Absolute: 0 10*3/uL (ref 0.0–0.2)
Basos: 1 %
EOS (ABSOLUTE): 0 10*3/uL (ref 0.0–0.4)
Eos: 1 %
Hematocrit: 36.5 % (ref 34.0–46.6)
Hemoglobin: 11.8 g/dL (ref 11.1–15.9)
Immature Grans (Abs): 0 10*3/uL (ref 0.0–0.1)
Immature Granulocytes: 0 %
Lymphocytes Absolute: 1.3 10*3/uL (ref 0.7–3.1)
Lymphs: 32 %
MCH: 30.5 pg (ref 26.6–33.0)
MCHC: 32.3 g/dL (ref 31.5–35.7)
MCV: 94 fL (ref 79–97)
Monocytes Absolute: 0.3 10*3/uL (ref 0.1–0.9)
Monocytes: 7 %
Neutrophils Absolute: 2.3 10*3/uL (ref 1.4–7.0)
Neutrophils: 59 %
Platelets: 266 10*3/uL (ref 150–450)
RBC: 3.87 x10E6/uL (ref 3.77–5.28)
RDW: 12.6 % (ref 11.7–15.4)
WBC: 3.9 10*3/uL (ref 3.4–10.8)

## 2019-04-11 LAB — HEMOGLOBIN A1C
Est. average glucose Bld gHb Est-mCnc: 111 mg/dL
Hgb A1c MFr Bld: 5.5 % (ref 4.8–5.6)

## 2019-05-22 ENCOUNTER — Encounter: Payer: Self-pay | Admitting: Plastic Surgery

## 2019-05-22 ENCOUNTER — Ambulatory Visit: Payer: BC Managed Care – PPO | Admitting: Plastic Surgery

## 2019-05-22 ENCOUNTER — Other Ambulatory Visit: Payer: Self-pay

## 2019-05-22 VITALS — BP 107/70 | HR 100 | Temp 98.0°F | Ht 64.5 in | Wt 128.0 lb

## 2019-05-22 DIAGNOSIS — C50311 Malignant neoplasm of lower-inner quadrant of right female breast: Secondary | ICD-10-CM

## 2019-05-22 DIAGNOSIS — Z9011 Acquired absence of right breast and nipple: Secondary | ICD-10-CM

## 2019-05-22 DIAGNOSIS — Z170 Estrogen receptor positive status [ER+]: Secondary | ICD-10-CM | POA: Diagnosis not present

## 2019-05-22 DIAGNOSIS — Z9889 Other specified postprocedural states: Secondary | ICD-10-CM | POA: Diagnosis not present

## 2019-05-22 NOTE — Progress Notes (Signed)
Patient ID: Carolyn Berg, female    DOB: [birth year], 53 y.o.   MRN: [id number]   Chief Complaint  Patient presents with  . Follow-up    for (R) breast implant  . Breast Problem    The patient is a 53 yrs old wf here for a yearly follow up on her breast reconstruction.  She was diagnosed with RIGHT breast cancer that was ER/PR positive, HER2 negative.  She had a right mastectomy with reconstruction. She has a silicone implant in place (Mentor smooth round moderate classic profile gel 130 cc).  I had any issues with her implant reconstruction.  She is doing well overall.  She has been safe with the Covid.  Her daughter is still in [place] but doing well for school.  She is not interested in tattoo of the nipple areola at this time.  She had a MRI last year and is scheduled for one this year.  She does not need it from a plastic surgery implant standpoint I believe she is planning on doing that per the recommendation of Dr. Lucia Gaskins.  I have asked if she will have the results sent to [place].      Review of Systems  Constitutional: Negative.   HENT: Negative.  Negative for congestion.   Respiratory: Negative.   Cardiovascular: Negative.  Negative for chest pain.  Gastrointestinal: Negative.  Negative for abdominal distention.  Endocrine: Negative.   Genitourinary: Negative.   Musculoskeletal: Negative.   Skin: Negative for color change and wound.  Neurological: Negative.   Hematological: Negative.   Psychiatric/Behavioral: Negative.     Past Medical History:  Diagnosis Date  . Anxiety    anticipation of mastectomy   . Basal cell carcinoma of right thigh 05/2013  . Breast cancer of lower-inner quadrant of right female breast ([place]) 02/07/2015  . Fibroids     Past Surgical History:  Procedure Laterality Date  . BREAST LUMPECTOMY WITH RADIOACTIVE SEED LOCALIZATION Left 05/25/2018   Procedure: LEFT BREAST LUMPECTOMY WITH RADIOACTIVE SEED LOCALIZATION ERAS PATHWAY;  Surgeon: Alphonsa Overall,  MD;  Location: [place];  Service: General;  Laterality: Left;  . BREAST RECONSTRUCTION WITH PLACEMENT OF TISSUE EXPANDER AND FLEX HD (ACELLULAR HYDRATED DERMIS) Right 03/13/2015  . BREAST RECONSTRUCTION WITH PLACEMENT OF TISSUE EXPANDER AND FLEX HD (ACELLULAR HYDRATED DERMIS) Right 03/13/2015   Procedure: IMMEDIATE RIGHT BREAST RECONSTRUCTION WITH PLACEMENT OF TISSUE EXPANDER AND FLEX PLIABLE 4X16 ;  Surgeon: Loel Lofty Aella Ronda, DO;  Location: [place];  Service: Plastics;  Laterality: Right;  . BREAST SURGERY Right 01/2015  . FINGER FRACTURE SURGERY Left    "middle finger"  . MASTECTOMY COMPLETE / SIMPLE W/ SENTINEL NODE BIOPSY Right 03/13/2015  . PARTIAL MASTECTOMY WITH AXILLARY SENTINEL LYMPH NODE BIOPSY Right 03/13/2015   Procedure: RIGHT MASTECTOMY WITH AXILLARY SENTINEL LYMPH NODE BIOPSY;  Surgeon: Alphonsa Overall, MD;  Location: [place];  Service: General;  Laterality: Right;  . REMOVAL OF TISSUE EXPANDER AND PLACEMENT OF IMPLANT Right 06/23/2015   Procedure: REMOVAL OF RIGHT BREAST TISSUE EXPANDER AND PLACEMENT OF SILICONE IMPLANT;  Surgeon: Wallace Going, DO;  Location: [place];  Service: Plastics;  Laterality: Right;  . SKIN BIOPSY Right 05/2013   basal cell on thigh  . VAGINAL DELIVERY     /w epidural   . WISDOM TOOTH EXTRACTION  early 2000's   /w IV sedation       Current Outpatient Medications:  Marland Kitchen  Multiple Vitamin (MULTIVITAMIN) capsule, Take 1  capsule by mouth daily., Disp: , Rfl:  .  tamoxifen (NOLVADEX) 20 MG tablet, Take 1 tablet (20 mg total) by mouth daily., Disp: 90 tablet, Rfl: 3   Objective:   Vitals:   05/22/19 1442  BP: 107/70  Pulse: 100  Temp: 98 F (36.7 C)  SpO2: 91%    Physical Exam Vitals and nursing note reviewed.  Constitutional:      Appearance: Normal appearance.  HENT:     Head: Normocephalic and atraumatic.  Cardiovascular:     Rate and Rhythm: Normal rate.     Pulses: Normal pulses.  Pulmonary:     Effort: Pulmonary  effort is normal.  Abdominal:     General: Abdomen is flat. There is no distension.  Musculoskeletal:        General: Normal range of motion.  Skin:    General: Skin is warm.     Capillary Refill: Capillary refill takes less than 2 seconds.  Neurological:     General: No focal deficit present.     Mental Status: She is alert and oriented to person, place, and time.  Psychiatric:        Mood and Affect: Mood normal.        Behavior: Behavior normal.        Thought Content: Thought content normal.     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  Send to the MRI results to [place].  Think about nipple areolar reconstruction.   The [place] was signed into law in 2016 which includes the topic of electronic health records.  This provides immediate access to information in MyChart.  This includes consultation notes, operative notes, office notes, lab results and pathology reports.  If you have any questions about what you read please let us know at your next visit or call us at the office.  We are right here with you.   [place], DO

## 2019-06-05 ENCOUNTER — Ambulatory Visit: Payer: BC Managed Care – PPO | Admitting: Obstetrics and Gynecology

## 2019-11-01 NOTE — Progress Notes (Signed)
[place]   [phone] [phone]   Clinic Follow up Note   Patient Care Team: Patient, No Pcp Per as PCP - General (General Practice) Alphonsa Overall, MD as Consulting Physician (General Surgery) Truitt Merle, MD as Consulting Physician (Hematology) Thea Silversmith, MD as Consulting Physician (Radiation Oncology) Mauro Kaufmann, RN as Registered Nurse Rockwell Germany, RN as Registered Nurse Jake Shark, Johny Blamer, NP as Nurse Practitioner (Nurse Practitioner) Marla Roe, Loel Lofty, DO as Consulting Physician (Plastic Surgery) Megan Salon, MD as Consulting Physician (Gynecology)  Date of Service:  11/02/2019  CHIEF COMPLAINT: F/u of right breast cancer   SUMMARY OF ONCOLOGIC HISTORY: Oncology History Overview Note  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    02/06/2015 Initial Biopsy   Right breast core needle biopsy showed DCIS with calcifications and necrosis.   02/07/2015 Breast MRI   Breast MRI showed non-mass enhancement involves majority of the lower, outer quadrant of the right breast spanning at least 3.4 x 5.0 x 5.2 cm. No other lesion or adenopathy.   02/07/2015 Clinical Stage   Stage 0: Tis N0   02/12/2015 Procedure   Breast/Ovarian Next panel reveals no clinically significant variant at ATM, BARD1, BRCA1, BRCA2, BRIP1, CDH1, CHEK2, EPCAM, FANCC, MLH1, MSH2, MSH6, NBN, PALB2, PMS2, PTEN, RAD51C, RAD51D, TP53,  and XRCC2   03/13/2015 Definitive Surgery   Right mastectomy/SLNB: invasive ductal carcinoma, 0.4cm, G2, ER/PR+, HER2neu neg, Ki67 10%, extensive DCIS, G2. margins were negative for invasive carcinoma, but DCIS was involved in anterior margin, and broadly <0.1cm on other margins - none to cut   03/13/2015 Pathologic Stage   Stage IA: T1a N0   03/28/2015 -  Anti-estrogen oral therapy   Tamoxifen 103m daily. Potential change to AI once post menopausal. Planned duration of therapy 10 years.   06/05/2015 Survivorship   Survivorship visit completed and copy of care plan given to patient.      CURRENT THERAPY:  Tamoxifen 248mdaily starting 03/28/15  INTERVAL HISTORY:  Carolyn Berg is here for a follow up of right breat cancer. She was last seen by me 1 year ago. She presents to the clinic alone.  She is doing well overall, she is tolerating tamoxifen well.  She does have intermittent moderate hot flash, manageable.  No joint pain, or other noticeable side effects.  She has good appetite and energy level, weight is stable, no other complaints.  Review of system otherwise negative.  MEDICAL HISTORY:  Past Medical History:  Diagnosis Date  . Anxiety    anticipation of mastectomy   . Basal cell carcinoma of right thigh 05/2013  . Breast [place] breast [place]  . Fibroids     SURGICAL HISTORY: Past Surgical History:  Procedure Laterality Date  . BREAST LUMPECTOMY WITH RADIOACTIVE SEED LOCALIZATION Left 05/25/2018   Procedure: LEFT BREAST LUMPECTOMY WITH RADIOACTIVE SEED  LOCALIZATION ERAS PATHWAY;  Surgeon: Alphonsa Overall, MD;  Location: [place];  Service: General;  Laterality: Left;  . BREAST RECONSTRUCTION WITH PLACEMENT OF TISSUE EXPANDER AND FLEX HD (ACELLULAR HYDRATED DERMIS) Right 03/13/2015  . BREAST RECONSTRUCTION WITH PLACEMENT OF TISSUE EXPANDER AND FLEX HD (ACELLULAR HYDRATED DERMIS) Right 03/13/2015   Procedure: IMMEDIATE RIGHT BREAST  RECONSTRUCTION WITH PLACEMENT OF TISSUE EXPANDER AND FLEX PLIABLE 4X16 ;  Surgeon: Loel Lofty Dillingham, DO;  Location: [facility];  Service: Plastics;  Laterality: Right;  . BREAST SURGERY Right 01/2015  . FINGER FRACTURE SURGERY Left    "middle finger"  . MASTECTOMY COMPLETE / SIMPLE W/ SENTINEL NODE BIOPSY Right 03/13/2015  . PARTIAL MASTECTOMY WITH AXILLARY SENTINEL LYMPH NODE BIOPSY Right 03/13/2015   Procedure: RIGHT MASTECTOMY WITH AXILLARY SENTINEL LYMPH NODE BIOPSY;  Surgeon: Alphonsa Overall, MD;  Location: [place];  Service: General;  Laterality: Right;  . REMOVAL OF TISSUE EXPANDER AND PLACEMENT OF IMPLANT Right 06/23/2015   Procedure: REMOVAL OF RIGHT BREAST TISSUE EXPANDER AND PLACEMENT OF SILICONE IMPLANT;  Surgeon: Wallace Going, DO;  Location: [place];  Service: Plastics;  Laterality: Right;  . SKIN BIOPSY Right 05/2013   basal cell on thigh  . VAGINAL DELIVERY     /w epidural   . WISDOM TOOTH EXTRACTION  early 2000's   /w IV sedation     I have reviewed the social history and family history with the patient and they are unchanged from previous note.  ALLERGIES:  has No Known Allergies.  MEDICATIONS:  Current Outpatient Medications  Medication Sig Dispense Refill  . Multiple Vitamin (MULTIVITAMIN) capsule Take 1 capsule by mouth daily.    . tamoxifen (NOLVADEX) 20 MG tablet Take 1 tablet (20 mg total) by mouth daily. 90 tablet 3   No current facility-administered medications for this visit.    PHYSICAL EXAMINATION: ECOG PERFORMANCE STATUS: 0 - Asymptomatic  Vitals:   11/02/19 1337  BP: (!) 98/55  Pulse: 79  Resp: 18  Temp: 97.7 F (36.5 C)  SpO2: 99%   Filed Weights   11/02/19 1337  Weight: 129 lb 9.6 oz (58.8 kg)    GENERAL:alert, no distress and comfortable SKIN: skin color, texture, turgor are normal, no rashes or significant lesions EYES: normal, Conjunctiva are pink and non-injected, sclera clear NECK: supple, thyroid normal size,  non-tender, without nodularity LYMPH:  no palpable lymphadenopathy in the cervical, axillary  LUNGS: clear to auscultation and percussion with normal breathing effort HEART: regular rate & rhythm and no murmurs and no lower extremity edema ABDOMEN:abdomen soft, non-tender and normal bowel sounds Musculoskeletal:no cyanosis of digits and no clubbing  NEURO: alert & oriented x 3 with fluent speech, no focal motor/sensory deficits Breasts: Post right mastectomy and implant placement.  No palpable nodule around implant or skin abnormality in right breast.  Palpation of the left  breasts and bilateral axilla revealed no obvious mass that I could appreciate.   LABORATORY DATA:  I have reviewed the data as listed CBC Latest Ref Rng & Units 11/02/2019 04/10/2019 11/03/2018  WBC 4.0 - 10.5 K/uL 4.7 3.9 4.7  Hemoglobin 12.0 - 15.0 g/dL 11.1(L) 11.8 11.0(L)  Hematocrit 36 - 46 % 33.9(L) 36.5 33.8(L)  Platelets 150 - 400 K/uL 244 266 233     CMP Latest Ref Rng & Units 11/02/2019 04/10/2019 11/03/2018  Glucose 70 - 99 mg/dL 144(H) 82 140(H)  BUN 6 - 20 mg/dL _0 Creatinine 0.44 - 1.00 mg/dL 0.88 0.80  0.90  Sodium 135 - 145 mmol/L 140 144 140  Potassium 3.5 - 5.1 mmol/L 3.5 4.3 3.8  Chloride 98 - 111 mmol/L 105 107(H) 104  CO2 22 - 32 mmol/L _0 Calcium 8.9 - 10.3 mg/dL 8.5(L) 8.8 8.5(L)  Total Protein 6.5 - 8.1 g/dL 6.8 6.5 6.9  Total Bilirubin 0.3 - 1.2 mg/dL 0.4 0.2 0.4  Alkaline Phos 38 - 126 U/L 46 51 48  AST 15 - 41 U/L _1 ALT 0 - 44 U/L _2 RADIOGRAPHIC STUDIES: I have personally reviewed the radiological images as listed and agreed with the findings in the report. No results found.   ASSESSMENT & PLAN:  Carolyn Berg is a 54 y.o. female with    1. 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 She  is s/p right mastectomy with reconstructionin 02/2015 -She is on adjuvant tamoxifensince 03/28/15, planning for total of 5-10 years.She istolerating tamoxifen wellwithmild hot flash, no other side effects, we'll continue.  Patient is willing to continue beyond 5 years, to further reduce her risk of recurrence. -Her last period was in early 2017. Her 03/2018 hormonal test showed her still perimenopausal.  -Her 02/2018 breast MRI showed hyperenhancing mass, 03/10/18 biopsy showed usual ductal hyperplasia. She underwent left breast lumpectomy to remove this on 05/25/18, no evidence of malignancy.  -She is clinically doing very well, exam was on labs reviewed, no clinical concern for recurrence. -Due to his extremely dense breast tissue, I recommend her to continue screening breast MRI every 2 years.  We will order 1 for her today. -Continue annual screening mammogram for left breast, next in October -Lab and follow-up in 1 year.  She will see Dr. Lucia Gaskins and her PCP   2. Genetic testing was negative    3. Anemia,mild andchronic  -She has had mild anemia since 02/2015 when she was diagnosed with breast cancer, and prior history of anemia in young age. She has not had her period since 06/2015.  -Her hemoglobin electrophoresis isnegative -She is 51, I encouraged her to have a screening colonoscopy. -Hg at 11 today (11/03/18). I encouraged her to take prenatal vitamin once daily.  She is not consistent taking multivitamin  Plan: -Continue Tamoxifen, refilledtoday  -Mammogram on 02/22/19 at [facility]  -Abbreviated breast MRI for breast cancer screening in the next 2 to 4 weeks at [place] and f/u in 1 year     No problem-specific Assessment & Plan notes found for this encounter.   Orders Placed This Encounter  Procedures  . MR BREAST W & WO CM SCREENING (GI)    Abbreviate breast MRI for cancer screening    Standing Status:   Future    Standing Expiration Date:    11/01/2020    Order Specific Question:   If indicated for the ordered procedure, I authorize the administration of contrast media per Radiology protocol    Answer:   Yes    Order Specific Question:   What is the patient's sedation requirement?    Answer:   No Sedation    Order Specific Question:   Does the patient have a pacemaker or implanted devices?    Answer:   No    Order Specific Question:   Preferred imaging location?    Answer:  [facility] (table limit-550lbs)    Order Specific Question:   Radiology Contrast Protocol - do NOT remove file path    Answer:   \\[email or web address]   All questions were answered. The patient knows to call the clinic with any problems, questions or concerns. No barriers to learning was detected. The total time spent in the appointment was 25 minutes.     Truitt Merle, MD 11/02/2019   I, Joslyn Devon, am acting as scribe for Truitt Merle, MD.   I have reviewed the above documentation for accuracy and completeness, and I agree with the above.

## 2019-11-02 ENCOUNTER — Inpatient Hospital Stay: Payer: BC Managed Care – PPO | Attending: Hematology

## 2019-11-02 ENCOUNTER — Inpatient Hospital Stay: Payer: BC Managed Care – PPO | Admitting: Hematology

## 2019-11-02 ENCOUNTER — Other Ambulatory Visit: Payer: Self-pay

## 2019-11-02 ENCOUNTER — Encounter: Payer: Self-pay | Admitting: Hematology

## 2019-11-02 VITALS — BP 98/55 | HR 79 | Temp 97.7°F | Resp 18 | Ht 64.5 in | Wt 129.6 lb

## 2019-11-02 DIAGNOSIS — Z170 Estrogen receptor positive status [ER+]: Secondary | ICD-10-CM | POA: Insufficient documentation

## 2019-11-02 DIAGNOSIS — C50311 Malignant neoplasm of lower-inner quadrant of right female breast: Secondary | ICD-10-CM

## 2019-11-02 DIAGNOSIS — D649 Anemia, unspecified: Secondary | ICD-10-CM | POA: Insufficient documentation

## 2019-11-02 DIAGNOSIS — Z79810 Long term (current) use of selective estrogen receptor modulators (SERMs): Secondary | ICD-10-CM | POA: Diagnosis not present

## 2019-11-02 DIAGNOSIS — Z9011 Acquired absence of right breast and nipple: Secondary | ICD-10-CM | POA: Diagnosis not present

## 2019-11-02 LAB — COMPREHENSIVE METABOLIC PANEL
ALT: 14 U/L (ref 0–44)
AST: 22 U/L (ref 15–41)
Albumin: 3.7 g/dL (ref 3.5–5.0)
Alkaline Phosphatase: 46 U/L (ref 38–126)
Anion gap: 6 (ref 5–15)
BUN: 11 mg/dL (ref 6–20)
CO2: 29 mmol/L (ref 22–32)
Calcium: 8.5 mg/dL — ABNORMAL LOW (ref 8.9–10.3)
Chloride: 105 mmol/L (ref 98–111)
Creatinine, Ser: 0.88 mg/dL (ref 0.44–1.00)
GFR calc Af Amer: >60 mL/min (ref >60)
GFR calc non Af Amer: >60 mL/min (ref >60)
Glucose, Bld: 144 mg/dL — ABNORMAL HIGH (ref 70–99)
Potassium: 3.5 mmol/L (ref 3.5–5.1)
Sodium: 140 mmol/L (ref 135–145)
Total Bilirubin: 0.4 mg/dL (ref 0.3–1.2)
Total Protein: 6.8 g/dL (ref 6.5–8.1)

## 2019-11-02 LAB — CBC WITH DIFFERENTIAL/PLATELET
Abs Immature Granulocytes: 0.01 10*3/uL (ref 0.00–0.07)
Basophils Absolute: 0 10*3/uL (ref 0.0–0.1)
Basophils Relative: 0 %
Eosinophils Absolute: 0.1 10*3/uL (ref 0.0–0.5)
Eosinophils Relative: 2 %
HCT: 33.9 % — ABNORMAL LOW (ref 36.0–46.0)
Hemoglobin: 11.1 g/dL — ABNORMAL LOW (ref 12.0–15.0)
Immature Granulocytes: 0 %
Lymphocytes Relative: 41 %
Lymphs Abs: 1.9 10*3/uL (ref 0.7–4.0)
MCH: 30.9 pg (ref 26.0–34.0)
MCHC: 32.7 g/dL (ref 30.0–36.0)
MCV: 94.4 fL (ref 80.0–100.0)
Monocytes Absolute: 0.3 10*3/uL (ref 0.1–1.0)
Monocytes Relative: 6 %
Neutro Abs: 2.4 10*3/uL (ref 1.7–7.7)
Neutrophils Relative %: 51 %
Platelets: 244 10*3/uL (ref 150–400)
RBC: 3.59 MIL/uL — ABNORMAL LOW (ref 3.87–5.11)
RDW: 13.1 % (ref 11.5–15.5)
WBC: 4.7 10*3/uL (ref 4.0–10.5)
nRBC: 0 % (ref 0.0–0.2)

## 2019-11-02 MED ORDER — TAMOXIFEN CITRATE 20 MG PO TABS: 20.0000 mg | ORAL_TABLET | Freq: Every day | ORAL | 3 refills | Status: DC

## 2019-12-13 ENCOUNTER — Ambulatory Visit
Admission: RE | Admit: 2019-12-13 | Discharge: 2019-12-13 | Disposition: A | Discharge status: Home / Self Care | Payer: BC Managed Care – PPO | Source: Ambulatory Visit | Attending: Hematology | Admitting: Hematology

## 2019-12-13 DIAGNOSIS — Z170 Estrogen receptor positive status [ER+]: Secondary | ICD-10-CM

## 2019-12-13 MED ORDER — GADOBUTROL 1 MMOL/ML IV SOLN
6.0000 mL | Freq: Once | INTRAVENOUS | Status: AC | PRN
  Administered 2019-12-13: 6 mL via INTRAVENOUS

## 2019-12-15 ENCOUNTER — Encounter: Payer: Self-pay | Admitting: Hematology

## 2019-12-24 ENCOUNTER — Telehealth: Payer: Self-pay

## 2019-12-24 ENCOUNTER — Telehealth: Payer: Self-pay | Admitting: Plastic Surgery

## 2019-12-24 NOTE — Telephone Encounter (Signed)
Patient called back for [facility]. Did not leave any additional details other than she is returning Bonita's call.

## 2019-12-24 NOTE — Telephone Encounter (Signed)
Call to pt- no answer/left v.m requesting call back from pt

## 2019-12-25 ENCOUNTER — Telehealth: Payer: Self-pay

## 2019-12-25 NOTE — Telephone Encounter (Signed)
Call to pt- per Dr. Marla Roe to report the pt's xray/study was negative for any concerns- & Dr. Marla Roe recommends that pt resume yearly mammogram as scheduled Pt did ask if she should keep her appointment with Dr. Marla Roe this December-& I informed her that Dr. Marla Roe does routinely like to have yearly visit- but she can certainly call office closer to her appointment & see if she could be seen by tele-visit or report her status. She is also reminded to call at anytime she has any concerns Pt agrees with plan of care.

## 2020-03-18 ENCOUNTER — Encounter: Payer: Self-pay | Admitting: Hematology

## 2020-05-09 ENCOUNTER — Encounter: Payer: Self-pay | Admitting: Obstetrics and Gynecology

## 2020-05-09 ENCOUNTER — Ambulatory Visit: Payer: BC Managed Care – PPO | Admitting: Obstetrics and Gynecology

## 2020-05-09 ENCOUNTER — Other Ambulatory Visit: Payer: Self-pay

## 2020-05-09 VITALS — BP 100/60 | HR 72 | Ht 64.25 in | Wt 131.0 lb

## 2020-05-09 DIAGNOSIS — Z01419 Encounter for gynecological examination (general) (routine) without abnormal findings: Secondary | ICD-10-CM | POA: Diagnosis not present

## 2020-05-09 NOTE — Patient Instructions (Signed)

## 2020-05-09 NOTE — Progress Notes (Signed)
54 y.o. G1P1 Married Caucasian female here for annual exam.    Having hot flashes on Tamoxifen.  No vaginal bleeding or spotting.   Received her Covid booster and flu vaccine.   Working at [employer].  Daughter home from school. Took a trip to [place].  PCP: No PCP    Patient's last menstrual period was 07/09/2015.           Sexually active: Yes.    The current method of family planning is post menopausal status.    Exercising: Yes.    workout classes, personal trainer Smoker:  no  Health Maintenance: Pap:  03/31/18 Neg:Neg HR HPV  11/14/15 Neg:Neg HR HPV History of abnormal Pap:  no MMG:  02/25/20 Left Breast MMG - BIRADS 1 negative/denstiy c -- Hx Rt.Br.Ca 2016.   Breast MRI 12/13/19 - no evidence of malignancy.  Colonoscopy:  04/2016 normal f/u 2027 BMD:   n/a  Result  n/a TDaP:  04/10/19 Gardasil:   n/a HIV: Neg in pregnancy Hep C: no Screening Labs: discuss today   reports that she has never smoked. She has never used smokeless tobacco. She reports current alcohol use. She reports that she does not use drugs.  Past Medical History:  Diagnosis Date  . Anxiety    anticipation of mastectomy   . Basal cell carcinoma of right thigh 05/2013  . Breast cancer of lower-inner quadrant of right female breast ([place]) 02/07/2015  . Fibroids     Past Surgical History:  Procedure Laterality Date  . BREAST LUMPECTOMY WITH RADIOACTIVE SEED LOCALIZATION Left 05/25/2018   Procedure: LEFT BREAST LUMPECTOMY WITH RADIOACTIVE SEED LOCALIZATION ERAS PATHWAY;  Surgeon: Alphonsa Overall, MD;  Location: [place];  Service: General;  Laterality: Left;  . BREAST RECONSTRUCTION WITH PLACEMENT OF TISSUE EXPANDER AND FLEX HD (ACELLULAR HYDRATED DERMIS) Right 03/13/2015  . BREAST RECONSTRUCTION WITH PLACEMENT OF TISSUE EXPANDER AND FLEX HD (ACELLULAR HYDRATED DERMIS) Right 03/13/2015   Procedure: IMMEDIATE RIGHT BREAST RECONSTRUCTION WITH PLACEMENT OF TISSUE EXPANDER AND FLEX PLIABLE 4X16 ;  Surgeon: Loel Lofty  Dillingham, DO;  Location: [place];  Service: Plastics;  Laterality: Right;  . BREAST SURGERY Right 01/2015  . FINGER FRACTURE SURGERY Left    "middle finger"  . MASTECTOMY COMPLETE / SIMPLE W/ SENTINEL NODE BIOPSY Right 03/13/2015  . PARTIAL MASTECTOMY WITH AXILLARY SENTINEL LYMPH NODE BIOPSY Right 03/13/2015   Procedure: RIGHT MASTECTOMY WITH AXILLARY SENTINEL LYMPH NODE BIOPSY;  Surgeon: Alphonsa Overall, MD;  Location: [place];  Service: General;  Laterality: Right;  . REMOVAL OF TISSUE EXPANDER AND PLACEMENT OF IMPLANT Right 06/23/2015   Procedure: REMOVAL OF RIGHT BREAST TISSUE EXPANDER AND PLACEMENT OF SILICONE IMPLANT;  Surgeon: Wallace Going, DO;  Location: [place];  Service: Plastics;  Laterality: Right;  . SKIN BIOPSY Right 05/2013   basal cell on thigh  . VAGINAL DELIVERY     /w epidural   . WISDOM TOOTH EXTRACTION  early 2000's   /w IV sedation     Current Outpatient Medications  Medication Sig Dispense Refill  . Multiple Vitamin (MULTIVITAMIN) capsule Take 1 capsule by mouth daily.    . tamoxifen (NOLVADEX) 20 MG tablet Take 1 tablet (20 mg total) by mouth daily. 90 tablet 3   No current facility-administered medications for this visit.    Family History  Problem Relation Age of Onset  . Diabetes Paternal Grandfather   . Heart attack Paternal Grandfather   . Colon cancer Maternal Grandfather 89  lived to 40  . Renal Disease Maternal Grandfather   . Breast cancer Other        MGF's mother dx over 47  . Lung cancer Maternal Uncle   . Stroke Maternal Grandmother     Review of Systems  Constitutional: Negative.   HENT: Negative.   Eyes: Negative.   Respiratory: Negative.   Cardiovascular: Negative.   Gastrointestinal: Negative.   Endocrine: Negative.   Genitourinary: Negative.   Musculoskeletal: Negative.   Skin: Negative.   Allergic/Immunologic: Negative.   Neurological: Negative.   Hematological: Negative.   Psychiatric/Behavioral:  Negative.     Exam:   BP 100/60 (BP Location: Left Arm, Patient Position: Sitting, Cuff Size: Normal)   Pulse 72   Ht 5' 4.25" (1.632 m)   Wt 131 lb (59.4 kg)   LMP 07/09/2015   BMI 22.31 kg/m     General appearance: alert, cooperative and appears stated age Head: normocephalic, without obvious abnormality, atraumatic Neck: no adenopathy, supple, symmetrical, trachea midline and thyroid normal to inspection and palpation Lungs: clear to auscultation bilaterally Breasts: Right breast absent with reconstruction.   Left breast with scar, no masses or tenderness, No nipple retraction or dimpling, No nipple discharge or bleeding, No axillary adenopathy Heart: regular rate and rhythm Abdomen: soft, non-tender; no masses, no organomegaly Extremities: extremities normal, atraumatic, no cyanosis or edema Skin: skin color, texture, turgor normal. No rashes or lesions Lymph nodes: cervical, supraclavicular, and axillary nodes normal. Neurologic: grossly normal  Pelvic: External genitalia:  no lesions              No abnormal inguinal nodes palpated.              Urethra:  normal appearing urethra with no masses, tenderness or lesions              Bartholins and Skenes: normal                 Vagina: normal appearing vagina with normal color and discharge, no lesions              Cervix: no lesions              Pap taken: No. Bimanual Exam:  Uterus:  normal size, contour, position, consistency, mobility, non-tender              Adnexa: no mass, fullness, tenderness              Rectal exam: Yes.  .  Confirms.              Anus:  normal sphincter tone, no lesions  Chaperone was present for exam.  Assessment:   Well woman visit with normal exam. Status post right mastectomy with reconstruction. Status post benign left breast lumpectomy.  Estrogen and progesterone positive breast cancer.  On Tamoxifen with menopausal symptoms. Genetic testing negative. Elevated glucose.    Plan: Mammogram screening discussed. Self breast awareness reviewed. Pap and HR HPV as above. Guidelines for Calcium, Vitamin D, regular exercise program including cardiovascular and weight bearing exercise. Lipids and A1C.  Follow up annually and prn.

## 2020-05-10 LAB — HEMOGLOBIN A1C
Est. average glucose Bld gHb Est-mCnc: 114 mg/dL
Hgb A1c MFr Bld: 5.6 % (ref 4.8–5.6)

## 2020-05-10 LAB — LIPID PANEL
Chol/HDL Ratio: 2.1 ratio (ref 0.0–4.4)
Cholesterol, Total: 177 mg/dL (ref 100–199)
HDL: 85 mg/dL (ref >39)
LDL Chol Calc (NIH): 79 mg/dL (ref 0–99)
Triglycerides: 67 mg/dL (ref 0–149)
VLDL Cholesterol Cal: 13 mg/dL (ref 5–40)

## 2020-05-20 ENCOUNTER — Encounter: Payer: Self-pay | Admitting: Plastic Surgery

## 2020-05-20 ENCOUNTER — Other Ambulatory Visit: Payer: Self-pay

## 2020-05-20 ENCOUNTER — Ambulatory Visit: Payer: BC Managed Care – PPO | Admitting: Plastic Surgery

## 2020-05-20 VITALS — BP 105/66 | HR 88 | Temp 97.8°F | Ht 64.0 in | Wt 128.0 lb

## 2020-05-20 DIAGNOSIS — Z9011 Acquired absence of right breast and nipple: Secondary | ICD-10-CM

## 2020-05-20 NOTE — Progress Notes (Signed)
Patient ID: Carolyn Berg, female    DOB: May 20, 1966, 54 y.o.   MRN: [id number]   Chief Complaint  Patient presents with  . Breast Problem    The patient is a 54 year old female here for a 1 year follow-up on her breast reconstruction.  She had right breast cancer and underwent a mastectomy with implant reconstruction 5 years ago.   She has a Product manager smooth round moderate classic profile 130 cc gel implant.  She is very happy with her results.  She is 5 feet 4 inches tall and weighs 128 pounds.  She is not interested in nipple areolar tattoo placement at this time.  There are no lumps or bumps.  She had an MRI 6 months ago and is planning to have one every other year on the right side and a mammogram every other year on the left.  The MRI was negative with no enhancing lesions or concerning lesions on the right breast.  The patient's daughter is still in college in [place] doing a Counsellor.  The patient is still working for [employer] here in [place].   Review of Systems  Constitutional: Negative.  Negative for activity change and appetite change.  Eyes: Negative.   Respiratory: Negative for chest tightness.   Cardiovascular: Negative.   Gastrointestinal: Negative.   Genitourinary: Negative.   Musculoskeletal: Negative.   Skin: Negative for color change and wound.  Hematological: Negative.   Psychiatric/Behavioral: Negative.     Past Medical History:  Diagnosis Date  . Anxiety    anticipation of mastectomy   . Basal cell carcinoma of right thigh 05/2013  . Breast cancer of lower-inner quadrant of right female breast ([place]) 02/07/2015  . Fibroids     Past Surgical History:  Procedure Laterality Date  . BREAST LUMPECTOMY WITH RADIOACTIVE SEED LOCALIZATION Left 05/25/2018   Procedure: LEFT BREAST LUMPECTOMY WITH RADIOACTIVE SEED LOCALIZATION ERAS PATHWAY;  Surgeon: Alphonsa Overall, MD;  Location: [place];  Service: General;  Laterality: Left;  . BREAST RECONSTRUCTION WITH  PLACEMENT OF TISSUE EXPANDER AND FLEX HD (ACELLULAR HYDRATED DERMIS) Right 03/13/2015  . BREAST RECONSTRUCTION WITH PLACEMENT OF TISSUE EXPANDER AND FLEX HD (ACELLULAR HYDRATED DERMIS) Right 03/13/2015   Procedure: IMMEDIATE RIGHT BREAST RECONSTRUCTION WITH PLACEMENT OF TISSUE EXPANDER AND FLEX PLIABLE 4X16 ;  Surgeon: Loel Lofty Samie Reasons, DO;  Location: [place];  Service: Plastics;  Laterality: Right;  . BREAST SURGERY Right 01/2015  . FINGER FRACTURE SURGERY Left    "middle finger"  . MASTECTOMY COMPLETE / SIMPLE W/ SENTINEL NODE BIOPSY Right 03/13/2015  . PARTIAL MASTECTOMY WITH AXILLARY SENTINEL LYMPH NODE BIOPSY Right 03/13/2015   Procedure: RIGHT MASTECTOMY WITH AXILLARY SENTINEL LYMPH NODE BIOPSY;  Surgeon: Alphonsa Overall, MD;  Location: [place];  Service: General;  Laterality: Right;  . REMOVAL OF TISSUE EXPANDER AND PLACEMENT OF IMPLANT Right 06/23/2015   Procedure: REMOVAL OF RIGHT BREAST TISSUE EXPANDER AND PLACEMENT OF SILICONE IMPLANT;  Surgeon: Wallace Going, DO;  Location: [place];  Service: Plastics;  Laterality: Right;  . SKIN BIOPSY Right 05/2013   basal cell on thigh  . VAGINAL DELIVERY     /w epidural   . WISDOM TOOTH EXTRACTION  early 2000's   /w IV sedation       Current Outpatient Medications:  Marland Kitchen  Multiple Vitamin (MULTIVITAMIN) capsule, Take 1 capsule by mouth daily., Disp: , Rfl:  .  tamoxifen (NOLVADEX) 20 MG tablet, Take 1 tablet (20 mg total) by  mouth daily., Disp: 90 tablet, Rfl: 3   Objective:   Vitals:   05/20/20 1501  BP: 105/66  Pulse: 88  Temp: 97.8 F (36.6 C)  SpO2: 96%    Physical Exam Vitals and nursing note reviewed.  Constitutional:      Appearance: Normal appearance.  HENT:     Head: Normocephalic and atraumatic.  Cardiovascular:     Rate and Rhythm: Normal rate.     Pulses: Normal pulses.  Pulmonary:     Effort: Pulmonary effort is normal. No respiratory distress.  Abdominal:     General: Abdomen is flat. There is  no distension.     Tenderness: There is no abdominal tenderness.  Skin:    General: Skin is warm.     Capillary Refill: Capillary refill takes less than 2 seconds.  Neurological:     General: No focal deficit present.     Mental Status: She is alert and oriented to person, place, and time.  Psychiatric:        Mood and Affect: Mood normal.        Behavior: Behavior normal.     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  She is not interested in nipple areola tattooing but knows it is available at any time.  Pictures were obtained of the patient and placed in the chart with the patient's or guardian's permission.   Carolyn Bills Jackqulyn Mendel, DO

## 2020-06-04 ENCOUNTER — Ambulatory Visit: Payer: BC Managed Care – PPO | Admitting: Obstetrics and Gynecology

## 2020-07-23 ENCOUNTER — Telehealth: Payer: Self-pay | Admitting: Nurse Practitioner

## 2020-07-23 NOTE — Telephone Encounter (Signed)
Left message with moved upcoming appointment due to provider's template. Gave option to call back to reschedule if needed. 

## 2020-10-29 ENCOUNTER — Other Ambulatory Visit: Payer: Self-pay

## 2020-10-29 DIAGNOSIS — Z170 Estrogen receptor positive status [ER+]: Secondary | ICD-10-CM

## 2020-10-30 ENCOUNTER — Encounter: Payer: Self-pay | Admitting: Nurse Practitioner

## 2020-10-30 ENCOUNTER — Other Ambulatory Visit: Payer: Self-pay

## 2020-10-30 ENCOUNTER — Inpatient Hospital Stay: Payer: BC Managed Care – PPO

## 2020-10-30 ENCOUNTER — Inpatient Hospital Stay: Payer: BC Managed Care – PPO | Attending: Nurse Practitioner | Admitting: Nurse Practitioner

## 2020-10-30 VITALS — BP 105/61 | HR 64 | Temp 97.8°F | Resp 16 | Ht 64.0 in | Wt 133.3 lb

## 2020-10-30 DIAGNOSIS — Z9011 Acquired absence of right breast and nipple: Secondary | ICD-10-CM | POA: Diagnosis not present

## 2020-10-30 DIAGNOSIS — Z170 Estrogen receptor positive status [ER+]: Secondary | ICD-10-CM

## 2020-10-30 DIAGNOSIS — C50311 Malignant neoplasm of lower-inner quadrant of right female breast: Secondary | ICD-10-CM | POA: Diagnosis present

## 2020-10-30 DIAGNOSIS — Z79810 Long term (current) use of selective estrogen receptor modulators (SERMs): Secondary | ICD-10-CM | POA: Insufficient documentation

## 2020-10-30 DIAGNOSIS — D649 Anemia, unspecified: Secondary | ICD-10-CM

## 2020-10-30 LAB — CBC WITH DIFFERENTIAL (CANCER CENTER ONLY)
Abs Immature Granulocytes: 0.01 10*3/uL (ref 0.00–0.07)
Basophils Absolute: 0 10*3/uL (ref 0.0–0.1)
Basophils Relative: 0 %
Eosinophils Absolute: 0.1 10*3/uL (ref 0.0–0.5)
Eosinophils Relative: 2 %
HCT: 33.2 % — ABNORMAL LOW (ref 36.0–46.0)
Hemoglobin: 10.9 g/dL — ABNORMAL LOW (ref 12.0–15.0)
Immature Granulocytes: 0 %
Lymphocytes Relative: 36 %
Lymphs Abs: 1.9 10*3/uL (ref 0.7–4.0)
MCH: 30.6 pg (ref 26.0–34.0)
MCHC: 32.8 g/dL (ref 30.0–36.0)
MCV: 93.3 fL (ref 80.0–100.0)
Monocytes Absolute: 0.5 10*3/uL (ref 0.1–1.0)
Monocytes Relative: 8 %
Neutro Abs: 2.9 10*3/uL (ref 1.7–7.7)
Neutrophils Relative %: 54 %
Platelet Count: 247 10*3/uL (ref 150–400)
RBC: 3.56 MIL/uL — ABNORMAL LOW (ref 3.87–5.11)
RDW: 14 % (ref 11.5–15.5)
WBC Count: 5.4 10*3/uL (ref 4.0–10.5)
nRBC: 0 % (ref 0.0–0.2)

## 2020-10-30 LAB — CMP (CANCER CENTER ONLY)
ALT: 12 U/L (ref 0–44)
AST: 20 U/L (ref 15–41)
Albumin: 3.6 g/dL (ref 3.5–5.0)
Alkaline Phosphatase: 49 U/L (ref 38–126)
Anion gap: 9 (ref 5–15)
BUN: 16 mg/dL (ref 6–20)
CO2: 28 mmol/L (ref 22–32)
Calcium: 8.8 mg/dL — ABNORMAL LOW (ref 8.9–10.3)
Chloride: 105 mmol/L (ref 98–111)
Creatinine: 1.03 mg/dL — ABNORMAL HIGH (ref 0.44–1.00)
GFR, Estimated: >60 mL/min (ref >60)
Glucose, Bld: 99 mg/dL (ref 70–99)
Potassium: 4 mmol/L (ref 3.5–5.1)
Sodium: 142 mmol/L (ref 135–145)
Total Bilirubin: 0.4 mg/dL (ref 0.3–1.2)
Total Protein: 7 g/dL (ref 6.5–8.1)

## 2020-10-30 MED ORDER — TAMOXIFEN CITRATE 20 MG PO TABS: 20.0000 mg | ORAL_TABLET | Freq: Every day | ORAL | 3 refills | Status: DC

## 2020-10-30 NOTE — Progress Notes (Signed)
Carolyn Berg   [phone] Fax:([phone]   Clinic Follow up Note   Patient Care Team: Patient, No Pcp Per (Inactive) as PCP - General (General Practice) Carolyn Overall, MD as Consulting Physician (General Surgery) Carolyn Merle, MD as Consulting Physician (Hematology) Carolyn Silversmith, MD as Consulting Physician (Radiation Oncology) Carolyn Kaufmann, RN as Registered Nurse Carolyn Germany, RN as Registered Nurse Carolyn Berg, Carolyn Blamer, NP as Nurse Practitioner (Nurse Practitioner) Carolyn Berg, Carolyn Lofty, DO as Consulting Physician (Plastic Surgery) Carolyn Salon, MD as Consulting Physician (Gynecology) Carolyn Cobbs, MD as Consulting Physician (Obstetrics and Gynecology) 10/30/2020  CHIEF COMPLAINT: Follow-up right breast cancer  SUMMARY OF ONCOLOGIC HISTORY: Oncology History Overview Note  Breast cancer of lower-inner quadrant of right female breast [facility])   Staging form: Breast, AJCC 7th Edition     Clinical stage from 02/12/2015: Stage 0 (Tis (DCIS), N0, M0) - Unsigned       Staging comments: Staged at breast conference on 9.21.16      Pathologic stage from 03/13/2015: Stage IA (T1a, N0, cM0) - Signed by Carolyn Merle, MD on 03/26/2015       Breast cancer of lower-inner quadrant of right female breast ([place])  01/28/2015 Mammogram   Screening mammogram showed new regional heterogeneous calcifications in the right breast central to the nipple midline depth.     02/06/2015 Initial Biopsy   Right breast core needle biopsy showed DCIS with calcifications and necrosis.    02/07/2015 Breast MRI   Breast MRI showed non-mass enhancement involves majority of the lower, outer quadrant of the right breast spanning at least 3.4 x 5.0 x 5.2 cm. No other lesion or adenopathy.    02/07/2015 Clinical Stage   Stage 0: Tis N0    02/12/2015 Procedure   Breast/Ovarian Next panel reveals no clinically significant variant at ATM, BARD1, BRCA1, BRCA2, BRIP1, CDH1,  CHEK2, EPCAM, FANCC, MLH1, MSH2, MSH6, NBN, PALB2, PMS2, PTEN, RAD51C, RAD51D, TP53, and XRCC2    03/13/2015 Definitive Surgery   Right mastectomy/SLNB: invasive ductal carcinoma, 0.4cm, G2, ER/PR+, HER2neu neg, Ki67 10%, extensive DCIS, G2. margins were negative for invasive carcinoma, but DCIS was involved in anterior margin, and broadly <0.1cm on other margins - none to cut    03/13/2015 Pathologic Stage   Stage IA: T1a N0    03/28/2015 -  Anti-estrogen oral therapy   Tamoxifen 62m daily. Potential change to AI once post menopausal. Planned duration of therapy 10 years.    06/05/2015 Survivorship   Survivorship visit completed and copy of care plan given to patient.      CURRENT THERAPY: Tamoxifen 20 mg daily starting 03/28/2015  INTERVAL HISTORY: Ms. DOddoreturns for annual follow-up as scheduled.  She was last seen 11/02/2019.  Screening breast MRI in 11/2019 was negative, left mammogram on 02/25/2020 was negative.  She continues tamoxifen.  She is tolerating well with moderate hot flashes.  Denies significant bone or joint pain, mood change, vaginal bleeding, or signs of thrombosis.  She is taking multivitamin and trying to be more consistent.  She has no other specific concerns.   MEDICAL HISTORY:  Past Medical History:  Diagnosis Date   Anxiety    anticipation of mastectomy    Basal cell carcinoma of right thigh 05/2013   Breast cancer of lower-inner quadrant of right female breast ([place] 02/07/2015   Fibroids     SURGICAL HISTORY: Past Surgical History:  Procedure Laterality Date   BREAST LUMPECTOMY WITH RADIOACTIVE SEED  LOCALIZATION Left 05/25/2018   Procedure: LEFT BREAST LUMPECTOMY WITH RADIOACTIVE SEED LOCALIZATION ERAS PATHWAY;  Surgeon: Carolyn Overall, MD;  Location: [place];  Service: General;  Laterality: Left;   BREAST RECONSTRUCTION WITH PLACEMENT OF TISSUE EXPANDER AND FLEX HD (ACELLULAR HYDRATED DERMIS) Right 03/13/2015   BREAST RECONSTRUCTION WITH PLACEMENT OF  TISSUE EXPANDER AND FLEX HD (ACELLULAR HYDRATED DERMIS) Right 03/13/2015   Procedure: IMMEDIATE RIGHT BREAST RECONSTRUCTION WITH PLACEMENT OF TISSUE EXPANDER AND FLEX PLIABLE 4X16 ;  Surgeon: Carolyn Lofty Dillingham, DO;  Location: [place];  Service: Plastics;  Laterality: Right;   BREAST SURGERY Right 01/2015   FINGER FRACTURE SURGERY Left    "middle finger"   MASTECTOMY COMPLETE / SIMPLE W/ SENTINEL NODE BIOPSY Right 03/13/2015   PARTIAL MASTECTOMY WITH AXILLARY SENTINEL LYMPH NODE BIOPSY Right 03/13/2015   Procedure: RIGHT MASTECTOMY WITH AXILLARY SENTINEL LYMPH NODE BIOPSY;  Surgeon: Carolyn Overall, MD;  Location: [place];  Service: General;  Laterality: Right;   REMOVAL OF TISSUE EXPANDER AND PLACEMENT OF IMPLANT Right 06/23/2015   Procedure: REMOVAL OF RIGHT BREAST TISSUE EXPANDER AND PLACEMENT OF SILICONE IMPLANT;  Surgeon: Carolyn Going, DO;  Location: [place];  Service: Plastics;  Laterality: Right;   SKIN BIOPSY Right 05/2013   basal cell on thigh   VAGINAL DELIVERY     /w epidural    WISDOM TOOTH EXTRACTION  early 2000's   /w IV sedation     I have reviewed the social history and family history with the patient and they are unchanged from previous note.  ALLERGIES:  has No Known Allergies.  MEDICATIONS:  Current Outpatient Medications  Medication Sig Dispense Refill   Multiple Vitamin (MULTIVITAMIN) capsule Take 1 capsule by mouth daily.     tamoxifen (NOLVADEX) 20 MG tablet Take 1 tablet (20 mg total) by mouth daily. 90 tablet 3   No current facility-administered medications for this visit.    PHYSICAL EXAMINATION: ECOG PERFORMANCE STATUS: 0 - Asymptomatic  Vitals:   10/30/20 1301  BP: 105/61  Pulse: 64  Resp: 16  Temp: 97.8 F (36.6 C)  SpO2: 100%   Filed Weights   10/30/20 1301  Weight: 133 lb 4.8 oz (60.5 kg)    GENERAL:alert, no distress and comfortable SKIN: No rash EYES: sclera clear NECK: Without mass LYMPH:  no palpable cervical or  supraclavicular lymphadenopathy  LUNGS: normal breathing effort HEART:  no lower extremity edema NEURO: alert & oriented x 3 with fluent speech, no focal motor/sensory deficits Breast exam: S/p right mastectomy and reconstruction, incisions completely healed.  Nipple surgically absent.  No palpable mass or nodularity along the incisions, reconstructed breast, or chest wall.  S/p left lumpectomy, incision completely healed.  No palpable mass in left breast or either axilla that I could appreciate  LABORATORY DATA:  I have reviewed the data as listed CBC Latest Ref Rng & Units 10/30/2020 11/02/2019 04/10/2019  WBC 4.0 - 10.5 K/uL 5.4 4.7 3.9  Hemoglobin 12.0 - 15.0 g/dL 10.9(L) 11.1(L) 11.8  Hematocrit 36.0 - 46.0 % 33.2(L) 33.9(L) 36.5  Platelets 150 - 400 K/uL 247 244 266     CMP Latest Ref Rng & Units 10/30/2020 11/02/2019 04/10/2019  Glucose 70 - 99 mg/dL 99 144(H) 82  BUN 6 - 20 mg/dL _0 Creatinine 0.44 - 1.00 mg/dL 1.03(H) 0.88 0.80  Sodium 135 - 145 mmol/L 142 140 144  Potassium 3.5 - 5.1 mmol/L 4.0 3.5 4.3  Chloride 98 - 111 mmol/L 105 105  107(H)  CO2 22 - 32 mmol/L _0 Calcium 8.9 - 10.3 mg/dL 8.8(L) 8.5(L) 8.8  Total Protein 6.5 - 8.1 g/dL 7.0 6.8 6.5  Total Bilirubin 0.3 - 1.2 mg/dL 0.4 0.4 0.2  Alkaline Phos 38 - 126 U/L 49 46 51  AST 15 - 41 U/L _1 ALT 0 - 44 U/L _2 RADIOGRAPHIC STUDIES: I have personally reviewed the radiological images as listed and agreed with the findings in the report. No results found.   ASSESSMENT & PLAN: 55 year old female  1. 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 S/p right mastectomy with reconstruction in 02/2015 -She is on adjuvant tamoxifen since 03/28/15, planning for total of 5-10 years.  Tolerating well with moderate hot flash  -Her last period was in early  2017. Her 03/2018 hormonal test showed her still perimenopausal. -Left mammogram in 02/2020 was negative, repeat annually -Screening breast MRI 11/2019 negative for malignancy, repeat every 2 years     2. Genetic testing was negative     3. Anemia, mild and chronic -She has had mild anemia since 02/2015 when she was diagnosed with breast cancer, and prior history of anemia in young age. She has not had her period since 06/2015. -Her hemoglobin electrophoresis is negative -She reports having had an unremarkable colonoscopy at age 81  -Hg 10.9 today, I recommend to add ferrous sulfate once daily in addition to multivitamin and check iron studies at her annual wellness visit in the fall. -I reviewed this could possibly be from tamoxifen, although not very common. -Continue monitoring.  If she has progressive anemia I will refer her back to repeat colonoscopy  Disposition:  Carolyn Berg is clinically doing well. She is tolerating tamoxifen with moderate hot flashes, no other significant toxicities. The plan is to continue for a total of 10 years. I refilled. Breast exam is benign. CBC and CMP are unremarkable except Hg 10.9. Berg no clinical concern for breast cancer recurrence.   I recommend to add ferrous sulfate 1 tab daily in addition to multivitamin for anemia. I will ask PCP to check iron/ferritin with annual visit in the Fall. If she has worsening anemia, or she does not respond, may refer her back to GI to repeat colonoscopy sooner.   Continue breast cancer surveillance and tamoxifen. L mammogram 02/25/21 at [facility] and screening breast MRI in 08/2020.   F/up in 1 year.   Orders Placed This Encounter  Procedures   MR BREAST W & WO CM SCREENING (GI)    Standing Status:   Future    Standing Expiration Date:   10/30/2021    Order Specific Question:   If indicated for the ordered procedure, I authorize the administration of contrast media per Radiology protocol    Answer:   Yes    Order Specific  Question:   What is the patient's sedation requirement?    Answer:   No Sedation    Order Specific Question:   Does the patient have a pacemaker or implanted devices?    Answer:   No    Order Specific Question:   Preferred imaging location?    Answer:   [facility] (table limit-550lbs)   Ferritin    Standing Status:   Future    Standing Expiration Date:   10/30/2021  Iron and TIBC    Standing Status:   Future    Standing Expiration Date:   10/30/2021   All questions were answered. The patient knows to call the clinic with any problems, questions or concerns. No barriers to learning were detected.     Carolyn Feeling, NP 10/30/20

## 2021-02-26 ENCOUNTER — Encounter: Payer: Self-pay | Admitting: Obstetrics and Gynecology

## 2021-05-19 ENCOUNTER — Ambulatory Visit: Payer: BC Managed Care – PPO | Admitting: Plastic Surgery

## 2021-05-19 ENCOUNTER — Other Ambulatory Visit: Payer: Self-pay

## 2021-05-19 ENCOUNTER — Encounter: Payer: Self-pay | Admitting: Plastic Surgery

## 2021-05-19 DIAGNOSIS — Z9889 Other specified postprocedural states: Secondary | ICD-10-CM

## 2021-05-19 DIAGNOSIS — Z170 Estrogen receptor positive status [ER+]: Secondary | ICD-10-CM | POA: Diagnosis not present

## 2021-05-19 DIAGNOSIS — C50311 Malignant neoplasm of lower-inner quadrant of right female breast: Secondary | ICD-10-CM | POA: Diagnosis not present

## 2021-05-19 NOTE — Progress Notes (Signed)
° °  Subjective:    Patient ID: Carolyn Berg, female    DOB: 1966/05/09, 54 y.o.   MRN: [id number]  The patient is a 55 year old female here for a 1 year follow-up on her breast surgery.  She was diagnosed with right lower inner quadrant DCIS by core needle biopsy in 2016.  It was estrogen and progesterone positive.  She underwent a right mastectomy with immediate reconstruction.  After the expander she had [place] smooth round moderate classic profile gel 130 cc implant placed in 2017.  She has not had nipple areolar reconstruction on the right. She is 5 feet 4 inches tall.  She has good symmetry and is very pleased with her results.  She has no signs of lumps or bumps.  No sign of capsular contracture.  I do not see or feel any concerning areas.    Review of Systems  Constitutional: Negative.   HENT: Negative.    Eyes: Negative.   Respiratory: Negative.    Cardiovascular: Negative.   Gastrointestinal: Negative.   Endocrine: Negative.   Genitourinary: Negative.   Musculoskeletal: Negative.   Skin: Negative.       Objective:   Physical Exam Constitutional:      Appearance: Normal appearance.  Cardiovascular:     Rate and Rhythm: Normal rate.     Pulses: Normal pulses.  Pulmonary:     Effort: Pulmonary effort is normal. No respiratory distress.  Abdominal:     General: There is no distension.     Palpations: Abdomen is soft.  Musculoskeletal:        General: No swelling or deformity.  Skin:    General: Skin is warm.     Capillary Refill: Capillary refill takes less than 2 seconds.     Coloration: Skin is not jaundiced.     Findings: No bruising or lesion.  Neurological:     Mental Status: She is alert and oriented to person, place, and time.  Psychiatric:        Mood and Affect: Mood normal.        Behavior: Behavior normal.        Thought Content: Thought content normal.        Judgment: Judgment normal.       Assessment & Plan:     ICD-10-CM   1. Status post right  breast reconstruction  Z98.890     2. Malignant neoplasm of lower-inner quadrant of right breast of female, estrogen receptor positive ([place])  C50.311    Z17.0       Continue massage.  If she wants nipple areolar tattooing that is available at any time.  She has plan with the oncologist for MRIs every other year.  She is scheduled for 1 this year.  She had a mammogram on the left breast in October and it was negative I saw the results today.  We will plan to see her back in 1 year. Pictures were obtained of the patient and placed in the chart with the patient's or guardian's permission.

## 2021-05-24 DIAGNOSIS — C4491 Basal cell carcinoma of skin, unspecified: Secondary | ICD-10-CM

## 2021-05-24 HISTORY — DX: Basal cell carcinoma of skin, unspecified: C44.91

## 2021-06-01 NOTE — Progress Notes (Signed)
56 y.o. G1P1 Married Caucasian female here for annual exam.    On Tamoxifen.  Having hot flashes mostly at night. Manageable.  No vaginal bleeding or spotting.   PCP:   None  Patient's last menstrual period was 07/09/2015.           Sexually active: Yes.    The current method of family planning is post menopausal status.    Exercising: Yes.     Yoga, weights, cardio Smoker:  no  Health Maintenance: Pap:  03/31/18 Neg:Neg HR HPV,   11/14/15 Neg:Neg HR HPV, 08-31-13 Neg History of abnormal Pap:  no MMG: 02-26-21 Neg/BiRads2.  MRI scheduled for 08/21/21.  Colonoscopy:  04/2016 normal f/u 2027 BMD:   n/a  Result  n/a TDaP:  04-10-19 Gardasil:   no HIV:Neg in preg Hep C:no Screening Labs:   Flu vaccine:  completed.  Covid bivalent booster:  completed.    reports that she has never smoked. She has never used smokeless tobacco. She reports that she does not currently use alcohol. She reports that she does not use drugs.  Past Medical History:  Diagnosis Date   Anxiety    anticipation of mastectomy    Basal cell carcinoma of right thigh 05/2013   Breast cancer of lower-inner quadrant of right female breast ([place]) 02/07/2015   Fibroids     Past Surgical History:  Procedure Laterality Date   BREAST LUMPECTOMY WITH RADIOACTIVE SEED LOCALIZATION Left 05/25/2018   Procedure: LEFT BREAST LUMPECTOMY WITH RADIOACTIVE SEED LOCALIZATION ERAS PATHWAY;  Surgeon: Alphonsa Overall, MD;  Location: [place];  Service: General;  Laterality: Left;   BREAST RECONSTRUCTION WITH PLACEMENT OF TISSUE EXPANDER AND FLEX HD (ACELLULAR HYDRATED DERMIS) Right 03/13/2015   BREAST RECONSTRUCTION WITH PLACEMENT OF TISSUE EXPANDER AND FLEX HD (ACELLULAR HYDRATED DERMIS) Right 03/13/2015   Procedure: IMMEDIATE RIGHT BREAST RECONSTRUCTION WITH PLACEMENT OF TISSUE EXPANDER AND FLEX PLIABLE 4X16 ;  Surgeon: Loel Lofty Dillingham, DO;  Location: [place];  Service: Plastics;  Laterality: Right;   BREAST SURGERY Right 01/2015    FINGER FRACTURE SURGERY Left    "middle finger"   MASTECTOMY COMPLETE / SIMPLE W/ SENTINEL NODE BIOPSY Right 03/13/2015   PARTIAL MASTECTOMY WITH AXILLARY SENTINEL LYMPH NODE BIOPSY Right 03/13/2015   Procedure: RIGHT MASTECTOMY WITH AXILLARY SENTINEL LYMPH NODE BIOPSY;  Surgeon: Alphonsa Overall, MD;  Location: [place];  Service: General;  Laterality: Right;   REMOVAL OF TISSUE EXPANDER AND PLACEMENT OF IMPLANT Right 06/23/2015   Procedure: REMOVAL OF RIGHT BREAST TISSUE EXPANDER AND PLACEMENT OF SILICONE IMPLANT;  Surgeon: Wallace Going, DO;  Location: [place];  Service: Plastics;  Laterality: Right;   SKIN BIOPSY Right 05/2013   basal cell on thigh   VAGINAL DELIVERY     /w epidural    WISDOM TOOTH EXTRACTION  early 2000's   /w IV sedation     Current Outpatient Medications  Medication Sig Dispense Refill   Multiple Vitamin (MULTIVITAMIN) capsule Take 1 capsule by mouth daily.     tamoxifen (NOLVADEX) 20 MG tablet Take 1 tablet (20 mg total) by mouth daily. 90 tablet 3   No current facility-administered medications for this visit.    Family History  Problem Relation Age of Onset   Diabetes Paternal Grandfather    Heart attack Paternal Grandfather    Colon cancer Maternal Grandfather 46       lived to 51   Renal Disease Maternal Grandfather    Breast cancer Other  MGF's mother dx over 56   Lung cancer Maternal Uncle    Stroke Maternal Grandmother     Review of Systems  All other systems reviewed and are negative.  Exam:   BP 100/62    Pulse 94    Ht 5\' 4"  (1.626 m)    Wt 137 lb (62.1 kg)    LMP 07/09/2015    SpO2 97%    BMI 23.52 kg/m     General appearance: alert, cooperative and appears stated age Head: normocephalic, without obvious abnormality, atraumatic Neck: no adenopathy, supple, symmetrical, trachea midline and thyroid normal to inspection and palpation Lungs: clear to auscultation bilaterally Breasts: left - normal appearance, no masses  or tenderness, No nipple retraction or dimpling, No nipple discharge or bleeding, No axillary adenopathy Right- absent breast with implant present, No axillary adenopathy Heart: regular rate and rhythm Abdomen: soft, non-tender; no masses, no organomegaly Extremities: extremities normal, atraumatic, no cyanosis or edema Skin: skin color, texture, turgor normal. No rashes or lesions Lymph nodes: cervical, supraclavicular, and axillary nodes normal. Neurologic: grossly normal  Pelvic: External genitalia:  no lesions              No abnormal inguinal nodes palpated.              Urethra:  normal appearing urethra with no masses, tenderness or lesions              Bartholins and Skenes: normal                 Vagina: normal appearing vagina with normal color and discharge, no lesions              Cervix: no lesions.  Large cervical ectropion.               Pap taken: yes Bimanual Exam:  Uterus:  normal size, contour, position, consistency, mobility, non-tender              Adnexa: no mass, fullness, tenderness              Rectal exam: yes.  Confirms.              Anus:  normal sphincter tone, no lesions  Chaperone was present for exam:  Estill Bamberg, CMA  Assessment:   Well woman visit with gynecologic exam. Status post right mastectomy with reconstruction.  Status post benign left breast lumpectomy.  Estrogen and progesterone positive breast cancer.  On Tamoxifen with menopausal symptoms. Genetic testing negative.   Plan: Mammogram screening and breast MRI yearly.  Self breast awareness reviewed. Pap and HR HPV collected.  Guidelines for Calcium, Vitamin D, regular exercise program including cardiovascular and weight bearing exercise.   Follow up annually and prn.   After visit summary provided.

## 2021-06-02 ENCOUNTER — Ambulatory Visit (INDEPENDENT_AMBULATORY_CARE_PROVIDER_SITE_OTHER): Payer: BC Managed Care – PPO | Admitting: Obstetrics and Gynecology

## 2021-06-02 ENCOUNTER — Other Ambulatory Visit (HOSPITAL_COMMUNITY)
Admission: RE | Admit: 2021-06-02 | Discharge: 2021-06-02 | Disposition: A | Discharge status: Home / Self Care | Payer: BC Managed Care – PPO | Source: Ambulatory Visit | Attending: Obstetrics and Gynecology | Admitting: Obstetrics and Gynecology

## 2021-06-02 ENCOUNTER — Other Ambulatory Visit: Payer: Self-pay

## 2021-06-02 ENCOUNTER — Encounter: Payer: Self-pay | Admitting: Obstetrics and Gynecology

## 2021-06-02 VITALS — BP 100/62 | HR 94 | Ht 64.0 in | Wt 137.0 lb

## 2021-06-02 DIAGNOSIS — Z01419 Encounter for gynecological examination (general) (routine) without abnormal findings: Secondary | ICD-10-CM | POA: Diagnosis not present

## 2021-06-02 DIAGNOSIS — Z124 Encounter for screening for malignant neoplasm of cervix: Secondary | ICD-10-CM

## 2021-06-02 NOTE — Patient Instructions (Signed)

## 2021-06-08 LAB — CYTOLOGY - PAP
Comment: NEGATIVE
Diagnosis: NEGATIVE
Diagnosis: REACTIVE
High risk HPV: NEGATIVE

## 2021-08-21 ENCOUNTER — Ambulatory Visit
Admission: RE | Admit: 2021-08-21 | Discharge: 2021-08-21 | Disposition: A | Discharge status: Home / Self Care | Payer: BC Managed Care – PPO | Source: Ambulatory Visit | Attending: Nurse Practitioner | Admitting: Nurse Practitioner

## 2021-08-21 DIAGNOSIS — Z170 Estrogen receptor positive status [ER+]: Secondary | ICD-10-CM

## 2021-08-21 MED ORDER — GADOBUTROL 1 MMOL/ML IV SOLN
6.0000 mL | Freq: Once | INTRAVENOUS | Status: AC | PRN
  Administered 2021-08-21: 6 mL via INTRAVENOUS

## 2021-10-21 ENCOUNTER — Telehealth: Payer: Self-pay | Admitting: Hematology

## 2021-10-21 NOTE — Telephone Encounter (Signed)
Rescheduled upcoming appointment due to provider's PAL. Patient is aware of changes. ?

## 2021-10-30 ENCOUNTER — Inpatient Hospital Stay: Payer: BC Managed Care – PPO | Admitting: Hematology

## 2021-10-30 ENCOUNTER — Inpatient Hospital Stay: Payer: BC Managed Care – PPO

## 2021-11-10 ENCOUNTER — Other Ambulatory Visit: Payer: Self-pay | Admitting: Lab

## 2021-11-10 ENCOUNTER — Inpatient Hospital Stay: Payer: BC Managed Care – PPO | Admitting: Hematology

## 2021-11-10 ENCOUNTER — Other Ambulatory Visit: Payer: Self-pay

## 2021-11-10 ENCOUNTER — Telehealth: Payer: Self-pay

## 2021-11-10 ENCOUNTER — Inpatient Hospital Stay: Payer: BC Managed Care – PPO | Attending: Hematology

## 2021-11-10 VITALS — BP 112/62 | HR 69 | Temp 98.4°F | Resp 16 | Ht 64.0 in | Wt 134.0 lb

## 2021-11-10 DIAGNOSIS — Z170 Estrogen receptor positive status [ER+]: Secondary | ICD-10-CM

## 2021-11-10 DIAGNOSIS — E2839 Other primary ovarian failure: Secondary | ICD-10-CM

## 2021-11-10 DIAGNOSIS — Z79810 Long term (current) use of selective estrogen receptor modulators (SERMs): Secondary | ICD-10-CM | POA: Diagnosis not present

## 2021-11-10 DIAGNOSIS — C50311 Malignant neoplasm of lower-inner quadrant of right female breast: Secondary | ICD-10-CM | POA: Insufficient documentation

## 2021-11-10 DIAGNOSIS — D649 Anemia, unspecified: Secondary | ICD-10-CM | POA: Diagnosis not present

## 2021-11-10 LAB — CBC WITH DIFFERENTIAL (CANCER CENTER ONLY)
Abs Immature Granulocytes: 0.01 10*3/uL (ref 0.00–0.07)
Basophils Absolute: 0 10*3/uL (ref 0.0–0.1)
Basophils Relative: 1 %
Eosinophils Absolute: 0.1 10*3/uL (ref 0.0–0.5)
Eosinophils Relative: 2 %
HCT: 34 % — ABNORMAL LOW (ref 36.0–46.0)
Hemoglobin: 11.6 g/dL — ABNORMAL LOW (ref 12.0–15.0)
Immature Granulocytes: 0 %
Lymphocytes Relative: 36 %
Lymphs Abs: 1.4 10*3/uL (ref 0.7–4.0)
MCH: 31.2 pg (ref 26.0–34.0)
MCHC: 34.1 g/dL (ref 30.0–36.0)
MCV: 91.4 fL (ref 80.0–100.0)
Monocytes Absolute: 0.3 10*3/uL (ref 0.1–1.0)
Monocytes Relative: 8 %
Neutro Abs: 2.1 10*3/uL (ref 1.7–7.7)
Neutrophils Relative %: 53 %
Platelet Count: 253 10*3/uL (ref 150–400)
RBC: 3.72 MIL/uL — ABNORMAL LOW (ref 3.87–5.11)
RDW: 12.6 % (ref 11.5–15.5)
WBC Count: 3.9 10*3/uL — ABNORMAL LOW (ref 4.0–10.5)
nRBC: 0 % (ref 0.0–0.2)

## 2021-11-10 LAB — CMP (CANCER CENTER ONLY)
ALT: 14 U/L (ref 0–44)
AST: 22 U/L (ref 15–41)
Albumin: 3.9 g/dL (ref 3.5–5.0)
Alkaline Phosphatase: 42 U/L (ref 38–126)
Anion gap: 3 — ABNORMAL LOW (ref 5–15)
BUN: 19 mg/dL (ref 6–20)
CO2: 29 mmol/L (ref 22–32)
Calcium: 9.4 mg/dL (ref 8.9–10.3)
Chloride: 107 mmol/L (ref 98–111)
Creatinine: 1.02 mg/dL — ABNORMAL HIGH (ref 0.44–1.00)
GFR, Estimated: >60 mL/min (ref >60)
Glucose, Bld: 93 mg/dL (ref 70–99)
Potassium: 4.5 mmol/L (ref 3.5–5.1)
Sodium: 139 mmol/L (ref 135–145)
Total Bilirubin: 0.3 mg/dL (ref 0.3–1.2)
Total Protein: 6.8 g/dL (ref 6.5–8.1)

## 2021-11-10 MED ORDER — TAMOXIFEN CITRATE 20 MG PO TABS: 20.0000 mg | ORAL_TABLET | Freq: Every day | ORAL | 3 refills | Status: DC

## 2021-11-10 NOTE — Progress Notes (Addendum)
[place]   [phone] [phone]   Clinic Follow up Note   Patient Care Team: Patient, No Pcp Per as PCP - General (General Practice) Alphonsa Overall, MD as Consulting Physician (General Surgery) Truitt Merle, MD as Consulting Physician (Hematology) Thea Silversmith, MD as Consulting Physician (Radiation Oncology) Mauro Kaufmann, RN as Registered Nurse Rockwell Germany, RN as Registered Nurse Jake Shark, Johny Blamer, NP as Nurse Practitioner (Nurse Practitioner) Marla Roe Loel Lofty, DO as Consulting Physician (Plastic Surgery) Megan Salon, MD as Consulting Physician (Gynecology) Nunzio Cobbs, MD as Consulting Physician (Obstetrics and Gynecology)  Date of Service:  11/10/2021  CHIEF COMPLAINT: f/u of right breast cancer  CURRENT THERAPY:  Tamoxifen 20 mg daily starting 03/28/2015  ASSESSMENT & PLAN:  Carolyn Berg is a 56 y.o. post-menopausal female with   1. 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 S/p right mastectomy with reconstruction in 02/2015 -She is on adjuvant tamoxifen since 03/28/15, planning for total of 10 years. Tolerating well with moderate hot flashes -Her last period was in early 2017. Her 03/2018 hormonal test showed her still perimenopausal. -most recent left MM 02/25/21 was benign, repeat annually -Screening breast MRI 08/21/21 was negative, repeat every 2 years   -she is clinically doing very well. Labs reviewed, overall stable. Physical exam was unremarkable. There is no clinical concern for recurrence. -continue tamoxifen and surveillance, next mammogram at [facility] already scheduled in 02/2022.   2. Genetic testing was negative     3. Anemia, mild and chronic -She has had mild anemia since 02/2015 when she was diagnosed with breast cancer, and prior history of anemia in young age. She  has not had her period since 06/2015. -Her hemoglobin electrophoresis is negative -She reports having had an unremarkable colonoscopy at age 56  -Hg 11.6 today (11/10/21). I recommend obtaining a stool test to rule out GI bleed; she is agreeable. -Continue monitoring.   4. Bone Health -she has never had a bone density screening. I will order one for baseline, anticipate in 02/2022 with MM. -I reviewed that tamoxifen can help strengthen her bone.   PLAN: -continue tamoxifen, I refilled today -obtain stool test -left MM and DEXA in 02/2022 -lab and f/u in 1 year  -will check iron levels at that visit   No problem-specific Assessment & Plan notes found for this encounter.   SUMMARY OF ONCOLOGIC HISTORY: Oncology History Overview Note  Breast cancer of lower-inner quadrant of right female breast [facility])   Staging form: Breast, AJCC 7th Edition     Clinical stage from 02/12/2015: Stage 0 (Tis (DCIS), N0, M0) - Unsigned       Staging comments: Staged at breast conference on 9.21.16      Pathologic stage from 03/13/2015: Stage IA (T1a, N0, cM0) - Signed by Truitt Merle, MD on 03/26/2015      Breast cancer of lower-inner quadrant of right female breast ([place])  01/28/2015 Mammogram   Screening mammogram showed new regional heterogeneous calcifications in the right breast central to the nipple midline depth.    02/06/2015 Initial Biopsy   Right breast core needle biopsy showed DCIS with calcifications and necrosis.   02/07/2015 Breast MRI   Breast MRI showed non-mass enhancement involves majority of the lower, outer quadrant of the right breast spanning at least 3.4 x 5.0 x 5.2 cm. No other lesion or adenopathy.  02/07/2015 Clinical Stage   Stage 0: Tis N0   02/12/2015 Procedure   Breast/Ovarian Next panel reveals no clinically significant variant at ATM, BARD1, BRCA1, BRCA2, BRIP1, CDH1, CHEK2, EPCAM, FANCC, MLH1, MSH2, MSH6, NBN, PALB2, PMS2, PTEN, RAD51C, RAD51D, TP53, and XRCC2    03/13/2015 Definitive Surgery   Right mastectomy/SLNB: invasive ductal carcinoma, 0.4cm, G2, ER/PR+, HER2neu neg, Ki67 10%, extensive DCIS, G2. margins were negative for invasive carcinoma, but DCIS was involved in anterior margin, and broadly <0.1cm on other margins - none to cut   03/13/2015 Pathologic Stage   Stage IA: T1a N0   03/28/2015 -  Anti-estrogen oral therapy   Tamoxifen 40m daily. Potential change to AI once post menopausal. Planned duration of therapy 10 years.   06/05/2015 Survivorship   Survivorship visit completed and copy of care plan given to patient.      INTERVAL HISTORY:  Carolyn Berg is here for a follow up of breast cancer. She was last seen by me a year ago. She presents to the clinic alone. She reports she is doing well overall. She reports continued hot flashes from tamoxifen, which sometimes causes difficulty sleeping. She tells me her daughter just completed her masters in literature.   All other systems were reviewed with the patient and are negative.  MEDICAL HISTORY:  Past Medical History:  Diagnosis Date   Anxiety    anticipation of mastectomy    Basal cell carcinoma of right thigh 05/2013   [place]   Fibroids     SURGICAL HISTORY: Past Surgical History:  Procedure Laterality Date   BREAST LUMPECTOMY WITH RADIOACTIVE SEED LOCALIZATION Left 05/25/2018   Procedure: LEFT BREAST LUMPECTOMY WITH RADIOACTIVE SEED LOCALIZATION ERAS PATHWAY;  Surgeon: NAlphonsa Overall MD;  [facility] [facility]  Service: General;  Laterality: Left;   BREAST RECONSTRUCTION WITH PLACEMENT OF TISSUE EXPANDER AND FLEX HD (ACELLULAR HYDRATED DERMIS) Right 03/13/2015   BREAST RECONSTRUCTION WITH PLACEMENT OF TISSUE EXPANDER AND FLEX HD (ACELLULAR HYDRATED DERMIS) Right 03/13/2015   Procedure: IMMEDIATE RIGHT BREAST RECONSTRUCTION WITH PLACEMENT OF TISSUE EXPANDER AND FLEX PLIABLE 4X16 ;  Surgeon: CLoel Lofty Dillingham, DO;  [place] [facility]  Service: Plastics;  Laterality: Right;   BREAST SURGERY Right 01/2015   FINGER FRACTURE SURGERY Left    "middle finger"   MASTECTOMY COMPLETE / SIMPLE W/ SENTINEL NODE BIOPSY Right 03/13/2015   PARTIAL MASTECTOMY WITH AXILLARY SENTINEL LYMPH NODE BIOPSY Right 03/13/2015   Procedure: RIGHT MASTECTOMY WITH AXILLARY SENTINEL LYMPH NODE BIOPSY;  Surgeon: DAlphonsa Overall MD;  [place] [facility]  Service: General;  Laterality: Right;   REMOVAL OF TISSUE EXPANDER AND PLACEMENT OF IMPLANT Right 06/23/2015   Procedure: REMOVAL OF RIGHT BREAST TISSUE EXPANDER AND PLACEMENT OF SILICONE IMPLANT;  Surgeon: CWallace Going DO;  [place] [facility]  Service: Plastics;  Laterality: Right;   SKIN BIOPSY Right 05/2013   basal cell on thigh   VAGINAL DELIVERY     /w epidural    WISDOM TOOTH EXTRACTION  early 2000's   /w IV sedation     I have reviewed the social history and family history with the patient and they are unchanged from previous note.  ALLERGIES:  has No Known Allergies.  MEDICATIONS:  Current Outpatient Medications  Medication Sig Dispense Refill   Multiple Vitamin (MULTIVITAMIN) capsule Take 1 capsule by mouth daily.     tamoxifen (NOLVADEX) 20 MG tablet Take 1 tablet (20 mg  total) by mouth daily. 90 tablet 3   No current facility-administered medications for this visit.    PHYSICAL EXAMINATION: ECOG PERFORMANCE STATUS: 0 - Asymptomatic  Vitals:   11/10/21 0810  BP: 112/62  Pulse: 69  Resp: 16  Temp: 98.4 F (36.9 C)  SpO2: 100%   Wt Readings from Last 3 Encounters:  11/10/21 134 lb (60.8 kg)  06/02/21 137 lb (62.1 kg)  10/30/20 133 lb 4.8 oz (60.5 kg)     GENERAL:alert, no distress and comfortable SKIN: skin color, texture, turgor are normal, no rashes or significant lesions EYES: normal, Conjunctiva are pink and non-injected, sclera clear  NECK: supple, thyroid normal size, non-tender, without nodularity LYMPH:  no  palpable lymphadenopathy in the cervical, axillary LUNGS: clear to auscultation and percussion with normal breathing effort HEART: regular rate & rhythm and no murmurs and no lower extremity edema ABDOMEN:abdomen soft, non-tender and normal bowel sounds Musculoskeletal:no cyanosis of digits and no clubbing  NEURO: alert & oriented x 3 with fluent speech, no focal motor/sensory deficits BREAST: No palpable mass, nodules or adenopathy bilaterally. Breast exam benign.   LABORATORY DATA:  I have reviewed the data as listed    Latest Ref Rng & Units 11/10/2021    8:00 AM 10/30/2020   12:33 PM 11/02/2019    1:12 PM  CBC  WBC 4.0 - 10.5 K/uL 3.9  5.4  4.7   Hemoglobin 12.0 - 15.0 g/dL 11.6  10.9  11.1   Hematocrit 36.0 - 46.0 % 34.0  33.2  33.9   Platelets 150 - 400 K/uL 253  247  244         Latest Ref Rng & Units 11/10/2021    8:00 AM 10/30/2020   12:33 PM 11/02/2019    1:12 PM  CMP  Glucose 70 - 99 mg/dL 93  99  144   BUN 6 - 20 mg/dL _0 Creatinine 0.44 - 1.00 mg/dL 1.02  1.03  0.88   Sodium 135 - 145 mmol/L 139  142  140   Potassium 3.5 - 5.1 mmol/L 4.5  4.0  3.5   Chloride 98 - 111 mmol/L 107  105  105   CO2 22 - 32 mmol/L _1 Calcium 8.9 - 10.3 mg/dL 9.4  8.8  8.5   Total Protein 6.5 - 8.1 g/dL 6.8  7.0  6.8   Total Bilirubin 0.3 - 1.2 mg/dL 0.3  0.4  0.4   Alkaline Phos 38 - 126 U/L 42  49  46   AST 15 - 41 U/L _2 ALT 0 - 44 U/L _3 RADIOGRAPHIC STUDIES: I have personally reviewed the radiological images as listed and agreed with the findings in the report. No results found.    Orders Placed This Encounter  Procedures   DG Bone Density    Standing Status:   Future    Standing Expiration Date:   11/10/2022    Scheduling Instructions:     [place]    Order Specific Question:   Reason for Exam (SYMPTOM  OR DIAGNOSIS REQUIRED)    Answer:   screening    Order Specific Question:   Is the patient pregnant?    Answer:   No    Order  Specific Question:   Preferred imaging location?    Answer:   External   Occult blood  card to lab, stool    Standing Status:   Future    Standing Expiration Date:   11/11/2022   Occult blood card to lab, stool    Standing Status:   Future    Standing Expiration Date:   11/11/2022   Occult blood card to lab, stool    Standing Status:   Future    Standing Expiration Date:   11/11/2022   Ferritin    Standing Status:   Future    Standing Expiration Date:   11/10/2022   All questions were answered. The patient knows to call the clinic with any problems, questions or concerns. No barriers to learning was detected. The total time spent in the appointment was 30 minutes.     Truitt Merle, MD 11/10/2021   I, Wilburn Mylar, am acting as scribe for Truitt Merle, MD.   I have reviewed the above documentation for accuracy and completeness, and I agree with the above.

## 2021-11-10 NOTE — Telephone Encounter (Signed)
Hemoccult Sensa Slide mailed to pt per Dr. Ernestina Penna request.

## 2021-11-18 ENCOUNTER — Encounter: Payer: Self-pay | Admitting: Hematology

## 2021-11-19 ENCOUNTER — Other Ambulatory Visit: Payer: Self-pay | Admitting: Nurse Practitioner

## 2021-11-19 ENCOUNTER — Other Ambulatory Visit: Payer: Self-pay

## 2021-12-02 ENCOUNTER — Other Ambulatory Visit: Payer: Self-pay

## 2021-12-02 ENCOUNTER — Other Ambulatory Visit (HOSPITAL_COMMUNITY)
Admission: RE | Admit: 2021-12-02 | Discharge: 2021-12-02 | Disposition: A | Discharge status: Home / Self Care | Payer: BC Managed Care – PPO | Attending: Hematology | Admitting: Hematology

## 2021-12-02 DIAGNOSIS — D649 Anemia, unspecified: Secondary | ICD-10-CM | POA: Insufficient documentation

## 2021-12-02 LAB — OCCULT BLOOD X 1 CARD TO LAB, STOOL
Fecal Occult Bld: NEGATIVE
Fecal Occult Bld: NEGATIVE
Fecal Occult Bld: NEGATIVE

## 2022-03-08 ENCOUNTER — Encounter: Payer: Self-pay | Admitting: Obstetrics and Gynecology

## 2022-03-11 ENCOUNTER — Encounter: Payer: Self-pay | Admitting: Obstetrics and Gynecology

## 2022-03-11 ENCOUNTER — Other Ambulatory Visit: Payer: Self-pay

## 2022-05-18 ENCOUNTER — Ambulatory Visit: Payer: BC Managed Care – PPO | Admitting: Plastic Surgery

## 2022-05-18 ENCOUNTER — Encounter: Payer: Self-pay | Admitting: Plastic Surgery

## 2022-05-18 DIAGNOSIS — Z9011 Acquired absence of right breast and nipple: Secondary | ICD-10-CM | POA: Diagnosis not present

## 2022-05-18 DIAGNOSIS — Z853 Personal history of malignant neoplasm of breast: Secondary | ICD-10-CM
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
## 2022-05-25 NOTE — Progress Notes (Signed)
57 y.o. G1P1 Married Caucasian female here for annual exam.    Having some hot flashes on her Tamoxifen.   Denies vaginal bleeding.   PCP:   None Oncologist:  Dr. Burr Medico  Patient's last menstrual period was 07/09/2015.           Sexually active: Yes.    The current method of family planning is post menopausal status.    Exercising: Yes.     Weight training, cardio, yoga, resistance training Smoker:  no  Health Maintenance: Pap:  06/02/21 negative: HR HPV negative, 03/31/18 negative: HR HPV negative History of abnormal Pap:  no MMG:  03/11/22 Breast Density Category C, BI-RADS CATEGORY 2 Benign Colonoscopy:  04/2016 normal  BMD:   03/08/22  Result  osteopenia bilateral hips.  TDaP:  04/10/19 Gardasil:   no HIV: neg in preg Hep C: no Screening Labs:  PCP/oncology. Flu vaccine:  completed.  Covid vaccine:  completed. Shingrix:  discussed.      reports that she has never smoked. She has never used smokeless tobacco. She reports that she does not currently use alcohol. She reports that she does not use drugs.  Past Medical History:  Diagnosis Date   Anxiety    anticipation of mastectomy    Basal cell carcinoma of right thigh 05/24/2013   Breast cancer of lower-inner quadrant of right female breast ([place]) 02/07/2015   Cancer of the skin, basal cell 2023   left ear   Fibroids     Past Surgical History:  Procedure Laterality Date   BREAST LUMPECTOMY WITH RADIOACTIVE SEED LOCALIZATION Left 05/25/2018   Procedure: LEFT BREAST LUMPECTOMY WITH RADIOACTIVE SEED LOCALIZATION ERAS PATHWAY;  Surgeon: Alphonsa Overall, MD;  Location: [place];  Service: General;  Laterality: Left;   BREAST RECONSTRUCTION WITH PLACEMENT OF TISSUE EXPANDER AND FLEX HD (ACELLULAR HYDRATED DERMIS) Right 03/13/2015   BREAST RECONSTRUCTION WITH PLACEMENT OF TISSUE EXPANDER AND FLEX HD (ACELLULAR HYDRATED DERMIS) Right 03/13/2015   Procedure: IMMEDIATE RIGHT BREAST RECONSTRUCTION WITH PLACEMENT OF TISSUE EXPANDER AND  FLEX PLIABLE 4X16 ;  Surgeon: Loel Lofty Dillingham, DO;  Location: [place];  Service: Plastics;  Laterality: Right;   BREAST SURGERY Right 01/2015   FINGER FRACTURE SURGERY Left    "middle finger"   MASTECTOMY COMPLETE / SIMPLE W/ SENTINEL NODE BIOPSY Right 03/13/2015   PARTIAL MASTECTOMY WITH AXILLARY SENTINEL LYMPH NODE BIOPSY Right 03/13/2015   Procedure: RIGHT MASTECTOMY WITH AXILLARY SENTINEL LYMPH NODE BIOPSY;  Surgeon: Alphonsa Overall, MD;  Location: [place];  Service: General;  Laterality: Right;   REMOVAL OF TISSUE EXPANDER AND PLACEMENT OF IMPLANT Right 06/23/2015   Procedure: REMOVAL OF RIGHT BREAST TISSUE EXPANDER AND PLACEMENT OF SILICONE IMPLANT;  Surgeon: Wallace Going, DO;  Location: [place];  Service: Plastics;  Laterality: Right;   SKIN BIOPSY Right 05/2013   basal cell on thigh   VAGINAL DELIVERY     /w epidural    WISDOM TOOTH EXTRACTION  early 2000's   /w IV sedation     Current Outpatient Medications  Medication Sig Dispense Refill   CALCIUM PO Take by mouth.     Multiple Vitamin (MULTIVITAMIN) capsule Take 1 capsule by mouth daily.     tamoxifen (NOLVADEX) 20 MG tablet TAKE 1 TABLET DAILY 90 tablet 3   No current facility-administered medications for this visit.    Family History  Problem Relation Age of Onset   Diabetes Paternal Grandfather    Heart attack Paternal Grandfather  Colon cancer Maternal Grandfather 62       lived to 73   Renal Disease Maternal Grandfather    Breast cancer Other        MGF's mother dx over 35   Lung cancer Maternal Uncle    Stroke Maternal Grandmother     Review of Systems  All other systems reviewed and are negative.   Exam:   BP 108/64 (BP Location: Right Arm, Patient Position: Sitting, Cuff Size: Normal)   Pulse 82   Ht '5\' 4"'$  (1.626 m)   Wt 130 lb (59 kg)   LMP 07/09/2015   SpO2 98%   BMI 22.31 kg/m     General appearance: alert, cooperative and appears stated age Head: normocephalic, without  obvious abnormality, atraumatic Neck: no adenopathy, supple, symmetrical, trachea midline and thyroid normal to inspection and palpation Lungs: clear to auscultation bilaterally Breasts: right breast - absent - reconstruction present - no axillary adenopathy.  Left breast - normal appearance, no masses or tenderness, No nipple retraction or dimpling, No nipple discharge or bleeding, No axillary adenopathy Heart: regular rate and rhythm Abdomen: soft, non-tender; no masses, no organomegaly Extremities: extremities normal, atraumatic, no cyanosis or edema Skin: skin color, texture, turgor normal. No rashes or lesions Lymph nodes: cervical, supraclavicular, and axillary nodes normal. Neurologic: grossly normal  Pelvic: External genitalia:  no lesions              No abnormal inguinal nodes palpated.              Urethra:  normal appearing urethra with no masses, tenderness or lesions              Bartholins and Skenes: normal                 Vagina: normal appearing vagina with normal color and discharge, no lesions              Cervix: no lesions              Pap taken: no Bimanual Exam:  Uterus:  normal size, contour, position, consistency, mobility, non-tender              Adnexa: no mass, fullness, tenderness              Rectal exam: yes.  Confirms.              Anus:  normal sphincter tone, no lesions  Chaperone was present for exam:  Raquel Sarna  Assessment:   Well woman visit with gynecologic exam. Status post right mastectomy with reconstruction.  Status post benign left breast lumpectomy.  Estrogen and progesterone positive breast cancer.  On Tamoxifen with menopausal symptoms. Genetic testing negative.   Plan: Mammogram screening discussed. Self breast awareness reviewed. Pap and HR HPV as above. Guidelines for Calcium, Vitamin D, regular exercise program including cardiovascular and weight bearing exercise.   Follow up annually and prn.   After visit summary provided.

## 2022-06-03 ENCOUNTER — Ambulatory Visit: Payer: BC Managed Care – PPO | Admitting: Obstetrics and Gynecology

## 2022-06-07 ENCOUNTER — Ambulatory Visit (INDEPENDENT_AMBULATORY_CARE_PROVIDER_SITE_OTHER): Payer: BC Managed Care – PPO | Admitting: Obstetrics and Gynecology

## 2022-06-07 ENCOUNTER — Encounter: Payer: Self-pay | Admitting: Obstetrics and Gynecology

## 2022-06-07 VITALS — BP 108/64 | HR 82 | Ht 64.0 in | Wt 130.0 lb

## 2022-06-07 DIAGNOSIS — Z01419 Encounter for gynecological examination (general) (routine) without abnormal findings: Secondary | ICD-10-CM

## 2022-06-07 NOTE — Patient Instructions (Signed)

## 2022-10-31 NOTE — Progress Notes (Unsigned)
Patient Care Team: Pcp, No as PCP - General Ovidio Kin, MD as Consulting Physician (General Surgery) Malachy Mood, MD as Consulting Physician (Hematology) Lurline Hare, MD as Consulting Physician (Radiation Oncology) Pershing Proud, RN as Registered Nurse Donnelly Angelica, RN as Registered Nurse Damita Lack, Marcy Salvo, NP as Nurse Practitioner (Nurse Practitioner) Ulice Bold, Alena Bills, DO as Consulting Physician (Plastic Surgery) Patton Salles, MD as Consulting Physician (Obstetrics and Gynecology) Ardell Isaacs, Forrestine Him, MD as Consulting Physician (Obstetrics and Gynecology)   CHIEF COMPLAINT Follow up right breast cancer    Oncology History Overview Note  Breast cancer of lower-inner quadrant of right female breast [facility])   Staging form: Breast, AJCC 7th Edition     Clinical stage from 02/12/2015: Stage 0 (Tis (DCIS), N0, M0) - Unsigned       Staging comments: Staged at breast conference on 9.21.16      Pathologic stage from 03/13/2015: Stage IA (T1a, N0, cM0) - Signed by Malachy Mood, MD on 03/26/2015      Breast cancer of lower-inner quadrant of right female breast (HCC)  01/28/2015 Mammogram   Screening mammogram showed new regional heterogeneous calcifications in the right breast central to the nipple midline depth.    02/06/2015 Initial Biopsy   Right breast core needle biopsy showed DCIS with calcifications and necrosis.   02/07/2015 Breast MRI   Breast MRI showed non-mass enhancement involves majority of the lower, outer quadrant of the right breast spanning at least 3.4 x 5.0 x 5.2 cm. No other lesion or adenopathy.   02/07/2015 Clinical Stage   Stage 0: Tis N0   02/12/2015 Procedure   Breast/Ovarian Next panel reveals no clinically significant variant at ATM, BARD1, BRCA1, BRCA2, BRIP1, CDH1, CHEK2, EPCAM, FANCC, MLH1, MSH2, MSH6, NBN, PALB2, PMS2, PTEN, RAD51C, RAD51D, TP53, and XRCC2   03/13/2015 Definitive Surgery   Right mastectomy/SLNB:  invasive ductal carcinoma, 0.4cm, G2, ER/PR+, HER2neu neg, Ki67 10%, extensive DCIS, G2. margins were negative for invasive carcinoma, but DCIS was involved in anterior margin, and broadly <0.1cm on other margins - none to cut   03/13/2015 Pathologic Stage   Stage IA: T1a N0   03/28/2015 -  Anti-estrogen oral therapy   Tamoxifen 20mg  daily. Potential change to AI once post menopausal. Planned duration of therapy 10 years.   06/05/2015 Survivorship   Survivorship visit completed and copy of care plan given to patient.      CURRENT THERAPY: Tamoxifen 20 mg daily, starting 03/28/2015   INTERVAL HISTORY Carolyn Berg returns for follow up as scheduled, last seen by Dr. Mosetta Putt 11/10/21. DEXA 02/2022 showed mild osteopenia, lowest T score -1.6, and low FRAX score. Mammogram also in 02/2022 was benign.   ROS   Past Medical History:  Diagnosis Date   Anxiety    anticipation of mastectomy    Basal cell carcinoma of right thigh 05/24/2013   Breast cancer of lower-inner quadrant of right female breast (HCC) 02/07/2015   Cancer of the skin, basal cell 2023   left ear   Fibroids      Past Surgical History:  Procedure Laterality Date   BREAST LUMPECTOMY WITH RADIOACTIVE SEED LOCALIZATION Left 05/25/2018   Procedure: LEFT BREAST LUMPECTOMY WITH RADIOACTIVE SEED LOCALIZATION ERAS PATHWAY;  Surgeon: Ovidio Kin, MD;  Location[facility] OR;  Service: General;  Laterality: Left;   BREAST RECONSTRUCTION WITH PLACEMENT OF TISSUE EXPANDER AND FLEX HD (ACELLULAR HYDRATED DERMIS) Right 03/13/2015   BREAST RECONSTRUCTION WITH PLACEMENT OF TISSUE  EXPANDER AND FLEX HD (ACELLULAR HYDRATED DERMIS) Right 03/13/2015   Procedure: IMMEDIATE RIGHT BREAST RECONSTRUCTION WITH PLACEMENT OF TISSUE EXPANDER AND FLEX PLIABLE 4X16 ;  Surgeon: Alena Bills Dillingham, DO;  Location: [facility] OR;  Service: Plastics;  Laterality: Right;   BREAST SURGERY Right 01/2015   FINGER FRACTURE SURGERY Left    "middle finger"   MASTECTOMY COMPLETE / SIMPLE  W/ SENTINEL NODE BIOPSY Right 03/13/2015   PARTIAL MASTECTOMY WITH AXILLARY SENTINEL LYMPH NODE BIOPSY Right 03/13/2015   Procedure: RIGHT MASTECTOMY WITH AXILLARY SENTINEL LYMPH NODE BIOPSY;  Surgeon: Ovidio Kin, MD;  Location: [facility] OR;  Service: General;  Laterality: Right;   REMOVAL OF TISSUE EXPANDER AND PLACEMENT OF IMPLANT Right 06/23/2015   Procedure: REMOVAL OF RIGHT BREAST TISSUE EXPANDER AND PLACEMENT OF SILICONE IMPLANT;  Surgeon: Peggye Form, DO;  Location: [facility];  Service: Plastics;  Laterality: Right;   SKIN BIOPSY Right 05/2013   basal cell on thigh   VAGINAL DELIVERY     /w epidural    WISDOM TOOTH EXTRACTION  early 2000's   /w IV sedation      Outpatient Encounter Medications as of 11/01/2022  Medication Sig   CALCIUM PO Take by mouth.   Multiple Vitamin (MULTIVITAMIN) capsule Take 1 capsule by mouth daily.   tamoxifen (NOLVADEX) 20 MG tablet TAKE 1 TABLET DAILY   No facility-administered encounter medications on file as of 11/01/2022.     There were no vitals filed for this visit. There is no height or weight on file to calculate BMI.   PHYSICAL EXAM GENERAL:alert, no distress and comfortable SKIN: no rash  EYES: sclera clear NECK: without mass LYMPH:  no palpable cervical or supraclavicular lymphadenopathy  LUNGS: clear with normal breathing effort HEART: regular rate & rhythm, no lower extremity edema ABDOMEN: abdomen soft, non-tender and normal bowel sounds NEURO: alert & oriented x 3 with fluent speech, no focal motor/sensory deficits Breast exam:  PAC without erythema    CBC    Component Value Date/Time   WBC 3.9 (L) 11/10/2021 0800   WBC 4.7 11/02/2019 1312   RBC 3.72 (L) 11/10/2021 0800   HGB 11.6 (L) 11/10/2021 0800   HGB 11.8 04/10/2019 0910   HGB 11.4 (L) 11/30/2016 1509   HCT 34.0 (L) 11/10/2021 0800   HCT 36.5 04/10/2019 0910   HCT 34.4 (L) 11/30/2016 1509   PLT 253 11/10/2021 0800   PLT 266 04/10/2019 0910    MCV 91.4 11/10/2021 0800   MCV 94 04/10/2019 0910   MCV 92.2 11/30/2016 1509   MCH 31.2 11/10/2021 0800   MCHC 34.1 11/10/2021 0800   RDW 12.6 11/10/2021 0800   RDW 12.6 04/10/2019 0910   RDW 12.9 11/30/2016 1509   LYMPHSABS 1.4 11/10/2021 0800   LYMPHSABS 1.3 04/10/2019 0910   LYMPHSABS 1.7 11/30/2016 1509   MONOABS 0.3 11/10/2021 0800   MONOABS 0.4 11/30/2016 1509   EOSABS 0.1 11/10/2021 0800   EOSABS 0.0 04/10/2019 0910   BASOSABS 0.0 11/10/2021 0800   BASOSABS 0.0 04/10/2019 0910   BASOSABS 0.0 11/30/2016 1509     CMP     Component Value Date/Time   NA 139 11/10/2021 0800   NA 144 04/10/2019 0910   NA 142 07/01/2016 1444   K 4.5 11/10/2021 0800   K 4.0 07/01/2016 1444   CL 107 11/10/2021 0800   CO2 29 11/10/2021 0800   CO2 27 07/01/2016 1444   GLUCOSE 93 11/10/2021 0800   GLUCOSE 89 07/01/2016  1444   BUN 19 11/10/2021 0800   BUN 17 04/10/2019 0910   BUN 19.3 07/01/2016 1444   CREATININE 1.02 (H) 11/10/2021 0800   CREATININE 0.8 07/01/2016 1444   CALCIUM 9.4 11/10/2021 0800   CALCIUM 8.9 07/01/2016 1444   PROT 6.8 11/10/2021 0800   PROT 6.5 04/10/2019 0910   PROT 6.8 07/01/2016 1444   ALBUMIN 3.9 11/10/2021 0800   ALBUMIN 4.1 04/10/2019 0910   ALBUMIN 3.7 07/01/2016 1444   AST 22 11/10/2021 0800   AST 22 07/01/2016 1444   ALT 14 11/10/2021 0800   ALT 17 07/01/2016 1444   ALKPHOS 42 11/10/2021 0800   ALKPHOS 49 07/01/2016 1444   BILITOT 0.3 11/10/2021 0800   BILITOT 0.33 07/01/2016 1444   GFRNONAA >60 11/10/2021 0800   GFRAA >60 11/02/2019 1312     ASSESSMENT & PLAN: 57 year-old female   1. 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 S/p right mastectomy with reconstruction in 02/2015  -She is on adjuvant tamoxifen since 03/28/15, planning for total of 10 years.  Tolerating well with moderate hot flash  -Her  last period was in early 2017. Her 03/2018 hormonal test showed her still perimenopausal. -last breast MRI 07/2021 and mammo 02/2022 were negative  2. Genetic testing was negative     3. Anemia, mild and chronic -She has had mild anemia since 02/2015 when she was diagnosed with breast cancer, and prior history of anemia in young age. She has not had her period since 06/2015. -Her hemoglobin electrophoresis is negative -She reports having had an unremarkable colonoscopy at age 22  -periodically on oral iron ***     PLAN:  No orders of the defined types were placed in this encounter.     All questions were answered. The patient knows to call the clinic with any problems, questions or concerns. No barriers to learning were detected. I spent *** counseling the patient face to face. The total time spent in the appointment was *** and more than 50% was on counseling, review of test results, and coordination of care.   Santiago Glad, NP-C @DATE @

## 2022-11-01 ENCOUNTER — Encounter: Payer: Self-pay | Admitting: Nurse Practitioner

## 2022-11-01 ENCOUNTER — Other Ambulatory Visit: Payer: Self-pay

## 2022-11-01 ENCOUNTER — Inpatient Hospital Stay: Payer: BC Managed Care – PPO | Attending: Nurse Practitioner

## 2022-11-01 ENCOUNTER — Inpatient Hospital Stay: Payer: BC Managed Care – PPO | Admitting: Nurse Practitioner

## 2022-11-01 VITALS — BP 95/73 | HR 87 | Temp 99.4°F | Wt 130.3 lb

## 2022-11-01 DIAGNOSIS — D649 Anemia, unspecified: Secondary | ICD-10-CM

## 2022-11-01 DIAGNOSIS — C50311 Malignant neoplasm of lower-inner quadrant of right female breast: Secondary | ICD-10-CM

## 2022-11-01 DIAGNOSIS — D709 Neutropenia, unspecified: Secondary | ICD-10-CM | POA: Insufficient documentation

## 2022-11-01 DIAGNOSIS — Z170 Estrogen receptor positive status [ER+]: Secondary | ICD-10-CM | POA: Insufficient documentation

## 2022-11-01 DIAGNOSIS — Z79810 Long term (current) use of selective estrogen receptor modulators (SERMs): Secondary | ICD-10-CM | POA: Diagnosis not present

## 2022-11-01 DIAGNOSIS — M8580 Other specified disorders of bone density and structure, unspecified site: Secondary | ICD-10-CM | POA: Diagnosis not present

## 2022-11-01 LAB — CMP (CANCER CENTER ONLY)
ALT: 13 U/L (ref 0–44)
AST: 25 U/L (ref 15–41)
Albumin: 4 g/dL (ref 3.5–5.0)
Alkaline Phosphatase: 40 U/L (ref 38–126)
Anion gap: 4 — ABNORMAL LOW (ref 5–15)
BUN: 16 mg/dL (ref 6–20)
CO2: 29 mmol/L (ref 22–32)
Calcium: 8.9 mg/dL (ref 8.9–10.3)
Chloride: 107 mmol/L (ref 98–111)
Creatinine: 0.92 mg/dL (ref 0.44–1.00)
GFR, Estimated: >60 mL/min (ref >60)
Glucose, Bld: 98 mg/dL (ref 70–99)
Potassium: 4.5 mmol/L (ref 3.5–5.1)
Sodium: 140 mmol/L (ref 135–145)
Total Bilirubin: 0.4 mg/dL (ref 0.3–1.2)
Total Protein: 7 g/dL (ref 6.5–8.1)

## 2022-11-01 LAB — CBC WITH DIFFERENTIAL (CANCER CENTER ONLY)
Abs Immature Granulocytes: 0 10*3/uL (ref 0.00–0.07)
Basophils Absolute: 0 10*3/uL (ref 0.0–0.1)
Basophils Relative: 1 %
Eosinophils Absolute: 0 10*3/uL (ref 0.0–0.5)
Eosinophils Relative: 1 %
HCT: 34.8 % — ABNORMAL LOW (ref 36.0–46.0)
Hemoglobin: 11.6 g/dL — ABNORMAL LOW (ref 12.0–15.0)
Immature Granulocytes: 0 %
Lymphocytes Relative: 41 %
Lymphs Abs: 1.3 10*3/uL (ref 0.7–4.0)
MCH: 30.7 pg (ref 26.0–34.0)
MCHC: 33.3 g/dL (ref 30.0–36.0)
MCV: 92.1 fL (ref 80.0–100.0)
Monocytes Absolute: 0.3 10*3/uL (ref 0.1–1.0)
Monocytes Relative: 8 %
Neutro Abs: 1.6 10*3/uL — ABNORMAL LOW (ref 1.7–7.7)
Neutrophils Relative %: 49 %
Platelet Count: 260 10*3/uL (ref 150–400)
RBC: 3.78 MIL/uL — ABNORMAL LOW (ref 3.87–5.11)
RDW: 13 % (ref 11.5–15.5)
WBC Count: 3.3 10*3/uL — ABNORMAL LOW (ref 4.0–10.5)
nRBC: 0 % (ref 0.0–0.2)

## 2022-11-01 LAB — FERRITIN: Ferritin: 120 ng/mL (ref 11–307)

## 2022-11-01 LAB — VITAMIN B12: Vitamin B-12: 253 pg/mL (ref 180–914)

## 2022-11-01 MED ORDER — TAMOXIFEN CITRATE 20 MG PO TABS: 20.0000 mg | ORAL_TABLET | Freq: Every day | ORAL | 3 refills | Status: DC

## 2022-11-01 NOTE — Addendum Note (Signed)
Addended by: Pollyann Samples on: 11/01/2022 01:31 PM   Modules accepted: Orders

## 2022-11-04 LAB — METHYLMALONIC ACID, SERUM: Methylmalonic Acid, Quantitative: 244 nmol/L (ref 0–378)

## 2023-01-06 ENCOUNTER — Encounter: Payer: Self-pay | Admitting: Nurse Practitioner

## 2023-01-07 ENCOUNTER — Other Ambulatory Visit: Payer: Self-pay

## 2023-01-07 MED ORDER — TAMOXIFEN CITRATE 20 MG PO TABS: 20.0000 mg | ORAL_TABLET | Freq: Every day | ORAL | 3 refills | Status: DC

## 2023-03-15 ENCOUNTER — Encounter: Payer: Self-pay | Admitting: Hematology

## 2023-05-02 ENCOUNTER — Inpatient Hospital Stay: Payer: BC Managed Care – PPO | Attending: Hematology

## 2023-05-02 DIAGNOSIS — D649 Anemia, unspecified: Secondary | ICD-10-CM | POA: Insufficient documentation

## 2023-05-02 DIAGNOSIS — C50311 Malignant neoplasm of lower-inner quadrant of right female breast: Secondary | ICD-10-CM | POA: Diagnosis present

## 2023-05-02 DIAGNOSIS — Z79810 Long term (current) use of selective estrogen receptor modulators (SERMs): Secondary | ICD-10-CM | POA: Diagnosis not present

## 2023-05-02 DIAGNOSIS — Z170 Estrogen receptor positive status [ER+]: Secondary | ICD-10-CM | POA: Diagnosis not present

## 2023-05-02 DIAGNOSIS — M8580 Other specified disorders of bone density and structure, unspecified site: Secondary | ICD-10-CM | POA: Diagnosis not present

## 2023-05-02 LAB — VITAMIN B12: Vitamin B-12: 477 pg/mL (ref 180–914)

## 2023-05-04 ENCOUNTER — Other Ambulatory Visit: Payer: Self-pay | Admitting: Nurse Practitioner

## 2023-05-04 DIAGNOSIS — D649 Anemia, unspecified: Secondary | ICD-10-CM

## 2023-05-20 ENCOUNTER — Ambulatory Visit (INDEPENDENT_AMBULATORY_CARE_PROVIDER_SITE_OTHER): Payer: BC Managed Care – PPO | Admitting: Plastic Surgery

## 2023-05-20 VITALS — BP 95/59 | HR 84

## 2023-05-20 DIAGNOSIS — Z9011 Acquired absence of right breast and nipple: Secondary | ICD-10-CM

## 2023-05-20 DIAGNOSIS — C50311 Malignant neoplasm of lower-inner quadrant of right female breast: Secondary | ICD-10-CM

## 2023-05-20 DIAGNOSIS — Z170 Estrogen receptor positive status [ER+]: Secondary | ICD-10-CM

## 2023-05-20 NOTE — Progress Notes (Signed)
   Subjective:    Patient ID: Carolyn Berg, female    DOB: February 01, 1966, 57 y.o.   MRN: [id number]  The patient is a 57 year old female here for a 1 year follow-up on her breast reconstruction.  In 2016 she was diagnosed with right lower inner quadrant DCIS.  It was estrogen and progesterone positive.  She underwent a right mastectomy with immediate reconstruction.  She has a Mentor smooth round moderate classic profile 130 cc implant in place on the right placed in January 2017.  She did not want nipple areolar reconstruction at this time.  She is 5 feet 4 inches tall and is all doing well. She has imaging scheduled for this spring.  Implant appears to be intact and no issues noted.      Review of Systems  Constitutional: Negative.   HENT: Negative.    Eyes: Negative.   Respiratory: Negative.    Cardiovascular: Negative.   Gastrointestinal: Negative.   Endocrine: Negative.   Genitourinary: Negative.   Musculoskeletal: Negative.        Objective:   Physical Exam Constitutional:      Appearance: Normal appearance.  Cardiovascular:     Rate and Rhythm: Normal rate.     Pulses: Normal pulses.  Pulmonary:     Effort: Pulmonary effort is normal.  Abdominal:     Palpations: Abdomen is soft.  Musculoskeletal:        General: No swelling.  Skin:    General: Skin is warm.     Capillary Refill: Capillary refill takes less than 2 seconds.  Neurological:     Mental Status: She is alert and oriented to person, place, and time.  Psychiatric:        Mood and Affect: Mood normal.        Behavior: Behavior normal.        Thought Content: Thought content normal.        Judgment: Judgment normal.        Assessment & Plan:     ICD-10-CM   1. 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 Malignant neoplasm of lower-inner quadrant of right breast of female, estrogen receptor positive (HCC)  C50.311    Z17.0        Pictures were obtained of the patient and placed in  the chart with the patient's or guardian's permission.  Plan follow-up in 1 year

## 2023-07-22 NOTE — Progress Notes (Signed)
 58 y.o. G1P1 Married Caucasian female here for annual exam.    Taking Tamoxifen for 10 years.  No bleeding.   Not working.  Traveling more.  Going skiing in [place].   PCP: Pcp, No   Patient's last menstrual period was 07/09/2015.           Sexually active: Yes.    The current method of family planning is post menopausal status.    Menopausal hormone therapy:  none  Exercising: Yes.     Weights cardio and yoga  Smoker:  no  OB History  Gravida Para Term Preterm AB Living  1 1    1   SAB IAB Ectopic Multiple Live Births          # Outcome Date GA Lbr Len/2nd Weight Sex Type Anes PTL Lv  1 Para              HEALTH MAINTENANCE: Last 2 paps:  06-02-21 neg HPV HR neg, 03-31-18 neg HPV HR neg History of abnormal Pap or positive HPV:  no Mammogram:   03-09-23 left breast birads 1:neg.  Doing MRI this month.  Does every other year.  Colonoscopy:  12/17 normal - due in 2027. Bone Density:  03-03-22  Result  osteopenia of bilateral hips.   Immunization History  Administered Date(s) Administered   Influenza Inj Mdck Quad Pf 02/23/2017   Influenza-Unspecified 01/23/2015, 02/21/2017, 02/14/2018   Janssen (J&J) SARS-COV-2 Vaccination 08/03/2019   PFIZER(Purple Top)SARS-COV-2 Vaccination 03/20/2020   Tdap 04/10/2019      reports that she has never smoked. She has never used smokeless tobacco. She reports that she does not currently use alcohol. She reports that she does not use drugs.  Past Medical History:  Diagnosis Date   Anxiety    anticipation of mastectomy    Basal cell carcinoma of right thigh 05/24/2013   Breast cancer of lower-inner quadrant of right female breast (HCC) 02/07/2015   Cancer of the skin, basal cell 2023   left ear   Fibroids     Past Surgical History:  Procedure Laterality Date   BREAST LUMPECTOMY WITH RADIOACTIVE SEED LOCALIZATION Left 05/25/2018   Procedure: LEFT BREAST LUMPECTOMY WITH RADIOACTIVE SEED LOCALIZATION ERAS PATHWAY;  Surgeon: Ovidio Kin, MD;  Location: [facility] OR;  Service: General;  Laterality: Left;   BREAST RECONSTRUCTION WITH PLACEMENT OF TISSUE EXPANDER AND FLEX HD (ACELLULAR HYDRATED DERMIS) Right 03/13/2015   BREAST RECONSTRUCTION WITH PLACEMENT OF TISSUE EXPANDER AND FLEX HD (ACELLULAR HYDRATED DERMIS) Right 03/13/2015   Procedure: IMMEDIATE RIGHT BREAST RECONSTRUCTION WITH PLACEMENT OF TISSUE EXPANDER AND FLEX PLIABLE 4X16 ;  Surgeon: Alena Bills Dillingham, DO;  Location: [facility] OR;  Service: Plastics;  Laterality: Right;   BREAST SURGERY Right 01/2015   FINGER FRACTURE SURGERY Left    "middle finger"   MASTECTOMY COMPLETE / SIMPLE W/ SENTINEL NODE BIOPSY Right 03/13/2015   PARTIAL MASTECTOMY WITH AXILLARY SENTINEL LYMPH NODE BIOPSY Right 03/13/2015   Procedure: RIGHT MASTECTOMY WITH AXILLARY SENTINEL LYMPH NODE BIOPSY;  Surgeon: Ovidio Kin, MD;  Location: [facility] OR;  Service: General;  Laterality: Right;   REMOVAL OF TISSUE EXPANDER AND PLACEMENT OF IMPLANT Right 06/23/2015   Procedure: REMOVAL OF RIGHT BREAST TISSUE EXPANDER AND PLACEMENT OF SILICONE IMPLANT;  Surgeon: Peggye Form, DO;  Location: [facility];  Service: Plastics;  Laterality: Right;   SKIN BIOPSY Right 05/2013   basal cell on thigh   VAGINAL DELIVERY     /w epidural  WISDOM TOOTH EXTRACTION  early 2000's   /w IV sedation     Current Outpatient Medications  Medication Sig Dispense Refill   CALCIUM PO Take by mouth.     Multiple Vitamin (MULTIVITAMIN) capsule Take 1 capsule by mouth daily.     tamoxifen (NOLVADEX) 20 MG tablet Take 1 tablet (20 mg total) by mouth daily. 90 tablet 3   No current facility-administered medications for this visit.    ALLERGIES: Patient has no known allergies.  Family History  Problem Relation Age of Onset   Diabetes Paternal Grandfather    Heart attack Paternal Grandfather    Colon cancer Maternal Grandfather 5       lived to 62   Renal Disease Maternal Grandfather    Breast cancer Other         MGF's mother dx over 66   Lung cancer Maternal Uncle    Stroke Maternal Grandmother     Review of Systems  All other systems reviewed and are negative.   PHYSICAL EXAM:  BP 116/70   Pulse 78   Ht 5\' 4"  (1.626 m)   Wt 126 lb 9.6 oz (57.4 kg)   LMP 07/09/2015   SpO2 100%   BMI 21.73 kg/m     General appearance: alert, cooperative and appears stated age Head: normocephalic, without obvious abnormality, atraumatic Neck: no adenopathy, supple, symmetrical, trachea midline and thyroid normal to inspection and palpation Lungs: clear to auscultation bilaterally Breasts: right - breast absent and reconstruction present, no axillary adenopathy.  Left - normal appearance, no masses or tenderness, No nipple retraction or dimpling, No nipple discharge or bleeding, No axillary adenopathy Heart: regular rate and rhythm Abdomen: soft, non-tender; no masses, no organomegaly Extremities: extremities normal, atraumatic, no cyanosis or edema Skin: skin color, texture, turgor normal. No rashes or lesions Lymph nodes: cervical, supraclavicular, and axillary nodes normal. Neurologic: grossly normal  Pelvic: External genitalia:  no lesions              No abnormal inguinal nodes palpated.              Urethra:  normal appearing urethra with no masses, tenderness or lesions              Bartholins and Skenes: normal                 Vagina: normal appearing vagina with normal color and discharge, no lesions              Cervix: no lesions              Pap taken: No. Bimanual Exam:  Uterus:  normal size, contour, position, consistency, mobility, non-tender              Adnexa: no mass, fullness, tenderness              Rectal exam: Yes.  .  Confirms.              Anus:  normal sphincter tone, no lesions  Chaperone was present for exam:  Antony Salmon, CMA  ASSESSMENT: Well woman visit with gynecologic exam. Status post right mastectomy with reconstruction.  Status post benign left breast  lumpectomy.  Estrogen and progesterone positive breast cancer.  On Tamoxifen with menopausal symptoms. Genetic testing negative.  PHQ-9: 0  PLAN: Mammogram screening discussed. Self breast awareness reviewed. Pap and HRV collected:  no.  Due in 2028.  Guidelines for Calcium, Vitamin D, regular exercise program including cardiovascular  and weight bearing exercise. Medication refills:  NA BMD at [facility].  Will fax order to them.  We discussed PCPs.  Follow up:  yearly and prn.

## 2023-07-27 ENCOUNTER — Encounter: Payer: Self-pay | Admitting: Obstetrics and Gynecology

## 2023-07-27 ENCOUNTER — Ambulatory Visit (INDEPENDENT_AMBULATORY_CARE_PROVIDER_SITE_OTHER): Payer: BC Managed Care – PPO | Admitting: Obstetrics and Gynecology

## 2023-07-27 VITALS — BP 116/70 | HR 78 | Ht 64.0 in | Wt 126.6 lb

## 2023-07-27 DIAGNOSIS — Z01419 Encounter for gynecological examination (general) (routine) without abnormal findings: Secondary | ICD-10-CM

## 2023-07-27 DIAGNOSIS — Z1331 Encounter for screening for depression: Secondary | ICD-10-CM | POA: Diagnosis not present

## 2023-07-27 DIAGNOSIS — Z780 Asymptomatic menopausal state: Secondary | ICD-10-CM | POA: Diagnosis not present

## 2023-07-27 NOTE — Patient Instructions (Signed)

## 2023-08-22 ENCOUNTER — Ambulatory Visit
Admission: RE | Admit: 2023-08-22 | Discharge: 2023-08-22 | Disposition: A | Discharge status: Home / Self Care | Payer: BC Managed Care – PPO | Source: Ambulatory Visit | Attending: Nurse Practitioner

## 2023-08-22 DIAGNOSIS — Z170 Estrogen receptor positive status [ER+]: Secondary | ICD-10-CM

## 2023-08-22 MED ORDER — GADOPICLENOL 0.5 MMOL/ML IV SOLN
6.0000 mL | Freq: Once | INTRAVENOUS | Status: AC | PRN
  Administered 2023-08-22: 6 mL via INTRAVENOUS

## 2023-08-23 ENCOUNTER — Encounter: Payer: Self-pay | Admitting: Nurse Practitioner

## 2023-11-02 ENCOUNTER — Inpatient Hospital Stay (HOSPITAL_BASED_OUTPATIENT_CLINIC_OR_DEPARTMENT_OTHER): Payer: BC Managed Care – PPO | Admitting: Nurse Practitioner

## 2023-11-02 ENCOUNTER — Other Ambulatory Visit: Payer: Self-pay | Admitting: Nurse Practitioner

## 2023-11-02 ENCOUNTER — Inpatient Hospital Stay: Payer: BC Managed Care – PPO | Attending: Hematology

## 2023-11-02 ENCOUNTER — Encounter: Payer: Self-pay | Admitting: Nurse Practitioner

## 2023-11-02 VITALS — BP 94/60 | HR 81 | Temp 98.3°F | Resp 19 | Ht 64.0 in | Wt 124.7 lb

## 2023-11-02 DIAGNOSIS — Z170 Estrogen receptor positive status [ER+]: Secondary | ICD-10-CM

## 2023-11-02 DIAGNOSIS — C50311 Malignant neoplasm of lower-inner quadrant of right female breast: Secondary | ICD-10-CM | POA: Diagnosis not present

## 2023-11-02 DIAGNOSIS — Z79810 Long term (current) use of selective estrogen receptor modulators (SERMs): Secondary | ICD-10-CM | POA: Diagnosis not present

## 2023-11-02 DIAGNOSIS — D649 Anemia, unspecified: Secondary | ICD-10-CM | POA: Diagnosis not present

## 2023-11-02 DIAGNOSIS — M8580 Other specified disorders of bone density and structure, unspecified site: Secondary | ICD-10-CM | POA: Diagnosis not present

## 2023-11-02 DIAGNOSIS — Z9011 Acquired absence of right breast and nipple: Secondary | ICD-10-CM | POA: Diagnosis not present

## 2023-11-02 LAB — CBC WITH DIFFERENTIAL (CANCER CENTER ONLY)
Abs Immature Granulocytes: 0.01 10*3/uL (ref 0.00–0.07)
Basophils Absolute: 0 10*3/uL (ref 0.0–0.1)
Basophils Relative: 1 %
Eosinophils Absolute: 0.1 10*3/uL (ref 0.0–0.5)
Eosinophils Relative: 2 %
HCT: 35.6 % — ABNORMAL LOW (ref 36.0–46.0)
Hemoglobin: 11.9 g/dL — ABNORMAL LOW (ref 12.0–15.0)
Immature Granulocytes: 0 %
Lymphocytes Relative: 45 %
Lymphs Abs: 1.8 10*3/uL (ref 0.7–4.0)
MCH: 30.6 pg (ref 26.0–34.0)
MCHC: 33.4 g/dL (ref 30.0–36.0)
MCV: 91.5 fL (ref 80.0–100.0)
Monocytes Absolute: 0.4 10*3/uL (ref 0.1–1.0)
Monocytes Relative: 11 %
Neutro Abs: 1.6 10*3/uL — ABNORMAL LOW (ref 1.7–7.7)
Neutrophils Relative %: 41 %
Platelet Count: 269 10*3/uL (ref 150–400)
RBC: 3.89 MIL/uL (ref 3.87–5.11)
RDW: 13.7 % (ref 11.5–15.5)
WBC Count: 3.9 10*3/uL — ABNORMAL LOW (ref 4.0–10.5)
nRBC: 0 % (ref 0.0–0.2)

## 2023-11-02 LAB — CMP (CANCER CENTER ONLY)
ALT: 13 U/L (ref 0–44)
AST: 21 U/L (ref 15–41)
Albumin: 4.1 g/dL (ref 3.5–5.0)
Alkaline Phosphatase: 42 U/L (ref 38–126)
Anion gap: 3 — ABNORMAL LOW (ref 5–15)
BUN: 18 mg/dL (ref 6–20)
CO2: 31 mmol/L (ref 22–32)
Calcium: 9.1 mg/dL (ref 8.9–10.3)
Chloride: 107 mmol/L (ref 98–111)
Creatinine: 0.91 mg/dL (ref 0.44–1.00)
GFR, Estimated: >60 mL/min (ref >60)
Glucose, Bld: 73 mg/dL (ref 70–99)
Potassium: 4.6 mmol/L (ref 3.5–5.1)
Sodium: 141 mmol/L (ref 135–145)
Total Bilirubin: 0.4 mg/dL (ref 0.0–1.2)
Total Protein: 7 g/dL (ref 6.5–8.1)

## 2023-11-02 NOTE — Progress Notes (Signed)
 [facility]   Telephone:([phone]) [phone] Fax:(336) [phone]    Patient Care Team: Pcp, No as PCP - General Carolyn Norlander, MD as Consulting Physician (General Surgery) Carolyn Riley, MD as Consulting Physician (Hematology) Beverlee Bucco, MD as Consulting Physician (Radiation Oncology) Auther Bo, RN as Registered Nurse Alane Hsu, RN as Registered Nurse Latisha Poland, Orlinda Blackbird, NP as Nurse Practitioner (Nurse Practitioner) Orin Birk, Carolyn Requena, DO as Consulting Physician (Plastic Surgery) [name], MD as Consulting Physician (Obstetrics and Gynecology) [name], [name], MD as Consulting Physician (Obstetrics and Gynecology)   CHIEF COMPLAINT: Follow up right breast cancer   Oncology History Overview Note  Breast cancer of lower-inner quadrant of right female breast [facility])   Staging form: Breast, AJCC 7th Edition     Clinical stage from 02/12/2015: Stage 0 (Tis (DCIS), N0, M0) - Unsigned       Staging comments: Staged at breast conference on 9.21.16      Pathologic stage from 03/13/2015: Stage IA (T1a, N0, cM0) - Signed by Carolyn [place], MD on 03/26/2015      Breast cancer of lower-inner quadrant of right female breast (HCC)  01/28/2015 Mammogram   Screening mammogram showed new regional heterogeneous calcifications in the right breast central to the nipple midline depth.    02/06/2015 Initial Biopsy   Right breast core needle biopsy showed DCIS with calcifications and necrosis.   02/07/2015 Breast MRI   Breast MRI showed non-mass enhancement involves majority of the lower, outer quadrant of the right breast spanning at least 3.4 x 5.0 x 5.2 cm. No other lesion or adenopathy.   02/07/2015 Clinical Stage   Stage 0: Tis N0   02/12/2015 Procedure   Breast/Ovarian Next panel reveals no clinically significant variant at ATM, BARD1, BRCA1, BRCA2, BRIP1, CDH1, CHEK2, EPCAM, FANCC, MLH1, MSH2, MSH6, NBN, PALB2, PMS2, PTEN, RAD51C, RAD51D,  TP53, and XRCC2   03/13/2015 Definitive Surgery   Right mastectomy/SLNB: invasive ductal carcinoma, 0.4cm, G2, ER/PR+, HER2neu neg, Ki67 10%, extensive DCIS, G2. margins were negative for invasive carcinoma, but DCIS was involved in anterior margin, and broadly <0.1cm on other margins - none to cut   03/13/2015 Pathologic Stage   Stage IA: T1a N0   03/28/2015 -  Anti-estrogen oral therapy   Tamoxifen  20mg  daily. Potential change to AI once post menopausal. Planned duration of therapy 10 years.   06/05/2015 Survivorship   Survivorship visit completed and copy of care plan given to patient.      CURRENT THERAPY: Tamoxifen  20 mg daily, starting 03/28/2015  INTERVAL HISTORY Ms. Conant returns for follow up as scheduled, last seen by me 11/01/22. Doing very well overall. Some hot flashes and joint stiffness after exercise are manageable. Denies new concerns. Continues age appropriate health care.   ROS  All other systems reviewed and negative  Past Medical History:  Diagnosis Date   Anxiety    anticipation of mastectomy    Basal cell carcinoma of right thigh 05/24/2013   Breast cancer of lower-inner quadrant of right female breast (HCC) 02/07/2015   Cancer of the skin, basal cell 2023   left ear   Fibroids      Past Surgical History:  Procedure Laterality Date   BREAST LUMPECTOMY WITH RADIOACTIVE SEED LOCALIZATION Left 05/25/2018   Procedure: LEFT BREAST LUMPECTOMY WITH RADIOACTIVE SEED LOCALIZATION ERAS PATHWAY;  Surgeon: Carolyn Norlander, MD;  [facility];  Service: General;  Laterality: Left;   BREAST RECONSTRUCTION  WITH PLACEMENT OF TISSUE EXPANDER AND FLEX HD (ACELLULAR HYDRATED DERMIS) Right 03/13/2015   BREAST RECONSTRUCTION WITH PLACEMENT OF TISSUE EXPANDER AND FLEX HD (ACELLULAR HYDRATED DERMIS) Right 03/13/2015   Procedure: IMMEDIATE RIGHT BREAST RECONSTRUCTION WITH PLACEMENT OF TISSUE EXPANDER AND FLEX PLIABLE 4X16 ;  Surgeon: Carolyn Berg Dillingham, DO;  Location: [facility] OR;   Service: Plastics;  Laterality: Right;   BREAST SURGERY Right 01/2015   FINGER FRACTURE SURGERY Left    middle finger   MASTECTOMY COMPLETE / SIMPLE W/ SENTINEL NODE BIOPSY Right 03/13/2015   PARTIAL MASTECTOMY WITH AXILLARY SENTINEL LYMPH NODE BIOPSY Right 03/13/2015   Procedure: RIGHT MASTECTOMY WITH AXILLARY SENTINEL LYMPH NODE BIOPSY;  Surgeon: Carolyn Norlander, MD;  Location: [facility] OR;  Service: General;  Laterality: Right;   REMOVAL OF TISSUE EXPANDER AND PLACEMENT OF IMPLANT Right 06/23/2015   Procedure: REMOVAL OF RIGHT BREAST TISSUE EXPANDER AND PLACEMENT OF SILICONE IMPLANT;  Surgeon: Thornell Flirt, DO;  Location: [facility];  Service: Plastics;  Laterality: Right;   SKIN BIOPSY Right 05/2013   basal cell on thigh   VAGINAL DELIVERY     /w epidural    WISDOM TOOTH EXTRACTION  early 2000's   /w IV sedation      Outpatient Encounter Medications as of 11/02/2023  Medication Sig   CALCIUM PO Take by mouth.   Multiple Vitamin (MULTIVITAMIN) capsule Take 1 capsule by mouth daily.   tamoxifen  (NOLVADEX ) 20 MG tablet Take 1 tablet (20 mg total) by mouth daily.   No facility-administered encounter medications on file as of 11/02/2023.     Today's Vitals   11/02/23 1113 11/02/23 1116  BP: 94/60   Pulse: 81   Resp: 19   Temp: 98.3 F (36.8 C)   TempSrc: Temporal   SpO2: 99%   Weight: 124 lb 11.2 oz (56.6 kg)   Height: 5' 4 (1.626 m)   PainSc:  0-No pain   Body mass index is 21.4 kg/m.   ECOG PERFORMANCE STATUS: 0 - Asymptomatic  PHYSICAL EXAM GENERAL:alert, no distress and comfortable SKIN: no rash  EYES: sclera clear NECK: without mass LYMPH:  no palpable cervical or supraclavicular lymphadenopathy  LUNGS: clear with normal breathing effort HEART: regular rate & rhythm, no lower extremity edema ABDOMEN: abdomen soft, non-tender and normal bowel sounds NEURO: alert & oriented x 3 with fluent speech, no focal motor/sensory deficits Breast exam: s/p  right mastectomy with implant, incisions completely healed. No palpable mass in either breast or axilla that I could appreciate    CBC    Latest Ref Rng & Units 11/02/2023   10:58 AM 11/01/2022    9:53 AM 11/10/2021    8:00 AM  CBC  WBC 4.0 - 10.5 K/uL 3.9  3.3  3.9   Hemoglobin 12.0 - 15.0 g/dL 56.2  13.0  86.5   Hematocrit 36.0 - 46.0 % 35.6  34.8  34.0   Platelets 150 - 400 K/uL 269  260  253       CMP     Latest Ref Rng & Units 11/01/2022    9:53 AM 11/10/2021    8:00 AM 10/30/2020   12:33 PM  CMP  Glucose 70 - 99 mg/dL 98  93  99   BUN 6 - 20 mg/dL 16  19  16    Creatinine 0.44 - 1.00 mg/dL 7.84  6.96  2.95   Sodium 135 - 145 mmol/L 140  139  142   Potassium 3.5 - 5.1 mmol/L  4.5  4.5  4.0   Chloride 98 - 111 mmol/L 107  107  105   CO2 22 - 32 mmol/L 29  29  28    Calcium 8.9 - 10.3 mg/dL 8.9  9.4  8.8   Total Protein 6.5 - 8.1 g/dL 7.0  6.8  7.0   Total Bilirubin 0.3 - 1.2 mg/dL 0.4  0.3  0.4   Alkaline Phos 38 - 126 U/L 40  42  49   AST 15 - 41 U/L 25  22  20    ALT 0 - 44 U/L 13  14  12        ASSESSMENT & PLAN:58 year-old female   1. 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 S/p right mastectomy with reconstruction in 02/2015  -She is on adjuvant tamoxifen  since 03/28/15, planning for total of 10 years.  Tolerating well with moderate hot flash  -Her last period was in early 2017. Her 03/2018 hormonal levels were perimenopausal. -last L mammo 02/2023 and MRI 08/22/23 were benign -Ms Favaro is clinically doing well, tolerating Tamoxifen , exam is benign and labs are stable. Overall no clinical concern for recurrence -Continue breast cancer surveillance and Tamoxifen  until 03/2025 -Continue annual L mammo/breast MRI staggered 6 months apart, we discussed spacing MRI or switch to CEM -F/up in 1 year, or sooner if needed   2.  Genetic testing was negative     3. Anemia, mild and chronic -She has had mild anemia since 02/2015 when she was diagnosed with breast cancer, and prior history of anemia in young age. She has not had her period since 06/2015. -Her hemoglobin electrophoresis is negative -She reports having had an unremarkable colonoscopy at age 76  -periodically on oral iron  -Stable anemia hgb 11 range, and leukopenia with neutropenia ANC 1.6 -Leukopenia is on SE profile for tamoxifen .    4. Osteopenia, RFN -on baseline DEXA 03/03/22, lowest T score -1.6; frax 6.8 % risk of major fracture in 10 years, and 0.6% risk of hip fracture -On daily calcium/vit D and weight bearing exercise       PLAN: -MRI and today's labs reviewed -Continue breast cancer surveillance and Tamoxifen  (complete 03/2025) -L mammo 02/2024, MRI 08/2024 (staggered 6 months apart) -F/up in 1 year, or sooner if needed   Orders Placed This Encounter  Procedures   MM DIAG BREAST TOMO UNI LEFT    Standing Status:   Future    Expected Date:   03/09/2024    Expiration Date:   11/01/2024    Reason for Exam (SYMPTOM  OR DIAGNOSIS REQUIRED):   h/o R mastectomy    Is the patient pregnant?:   No    Preferred imaging location[facility]   MR BREAST BILATERAL W WO CONTRAST INC CAD    Standing Status:   Future    Expected Date:   08/31/2024    Expiration Date:   11/01/2024    If indicated for the ordered procedure, I authorize the administration of contrast media per Radiology protocol:   Yes    What is the patient's sedation requirement?:   No Sedation    Does the patient have a pacemaker or implanted devices?:   No    Preferred imaging location?:   [facility] (table limit-550lbs)      All questions were answered. The patient knows to call  the clinic with any problems, questions or concerns. No barriers to learning were detected.   Alzada Brazee K Michaeljohn Biss, NP 11/02/2023

## 2023-12-12 ENCOUNTER — Other Ambulatory Visit: Payer: Self-pay | Admitting: Nurse Practitioner

## 2024-03-26 ENCOUNTER — Encounter: Payer: Self-pay | Admitting: Nurse Practitioner

## 2024-03-28 LAB — HM DEXA SCAN

## 2024-03-29 ENCOUNTER — Encounter: Payer: Self-pay | Admitting: Obstetrics and Gynecology

## 2024-03-30 ENCOUNTER — Ambulatory Visit: Payer: Self-pay | Admitting: Obstetrics and Gynecology

## 2024-04-11 ENCOUNTER — Encounter: Payer: Self-pay | Admitting: Hematology

## 2024-05-18 ENCOUNTER — Ambulatory Visit: Payer: BC Managed Care – PPO | Admitting: Plastic Surgery

## 2024-05-22 ENCOUNTER — Encounter: Payer: Self-pay | Admitting: Plastic Surgery

## 2024-05-22 ENCOUNTER — Ambulatory Visit (INDEPENDENT_AMBULATORY_CARE_PROVIDER_SITE_OTHER): Admitting: Plastic Surgery

## 2024-05-22 VITALS — BP 118/68 | HR 83 | Ht 64.0 in | Wt 129.0 lb

## 2024-05-22 DIAGNOSIS — Z08 Encounter for follow-up examination after completed treatment for malignant neoplasm: Secondary | ICD-10-CM

## 2024-05-22 DIAGNOSIS — Z9011 Acquired absence of right breast and nipple: Secondary | ICD-10-CM | POA: Diagnosis not present

## 2024-05-22 DIAGNOSIS — Z853 Personal history of malignant neoplasm of breast: Secondary | ICD-10-CM | POA: Diagnosis not present

## 2024-05-22 DIAGNOSIS — Z170 Estrogen receptor positive status [ER+]: Secondary | ICD-10-CM

## 2024-05-22 NOTE — Progress Notes (Signed)
 "    Patient ID: Carolyn Berg, female    DOB: [birth year], 58 y.o.   MRN: [id number]   Chief Complaint  Patient presents with   Follow-up   Breast Cancer   Breast Problem    The patient is a 58 year old female here for yearly visit for her breast reconstruction.  She had routine mammogram done in 2016 and was found to have DCIS.  She underwent a left mastectomy with the start of reconstruction in October 2016.  Then in January 2017 she had [place] smooth round moderate classic profile gel 130 cc implant placed.  She is very pleased with her results and has good symmetry.  There have not been any issues.  The implant appears to be intact on physical exam.  She is not interested in nipple areola tattooing at this time.     Review of Systems  Constitutional: Negative.   HENT: Negative.    Eyes: Negative.   Respiratory: Negative.    Cardiovascular: Negative.   Gastrointestinal: Negative.   Endocrine: Negative.   Genitourinary: Negative.   Musculoskeletal: Negative.     Past Medical History:  Diagnosis Date   Anxiety    anticipation of mastectomy    Basal cell carcinoma of right thigh 05/24/2013   Breast cancer of lower-inner quadrant of right female breast (HCC) 02/07/2015   Cancer of the skin, basal cell 2023   left ear   Fibroids     Past Surgical History:  Procedure Laterality Date   BREAST LUMPECTOMY WITH RADIOACTIVE SEED LOCALIZATION Left 05/25/2018   Procedure: LEFT BREAST LUMPECTOMY WITH RADIOACTIVE SEED LOCALIZATION ERAS PATHWAY;  Surgeon: Ethyl Lenis, MD;  Location: [facility] OR;  Service: General;  Laterality: Left;   BREAST RECONSTRUCTION WITH PLACEMENT OF TISSUE EXPANDER AND FLEX HD (ACELLULAR HYDRATED DERMIS) Right 03/13/2015   BREAST RECONSTRUCTION WITH PLACEMENT OF TISSUE EXPANDER AND FLEX HD (ACELLULAR HYDRATED DERMIS) Right 03/13/2015   Procedure: IMMEDIATE RIGHT BREAST RECONSTRUCTION WITH PLACEMENT OF TISSUE EXPANDER AND FLEX PLIABLE 4X16 ;  Surgeon: Estefana RAMAN  Christabella Alvira, DO;  Location: [facility] OR;  Service: Plastics;  Laterality: Right;   BREAST SURGERY Right 01/2015   FINGER FRACTURE SURGERY Left    middle finger   MASTECTOMY COMPLETE / SIMPLE W/ SENTINEL NODE BIOPSY Right 03/13/2015   PARTIAL MASTECTOMY WITH AXILLARY SENTINEL LYMPH NODE BIOPSY Right 03/13/2015   Procedure: RIGHT MASTECTOMY WITH AXILLARY SENTINEL LYMPH NODE BIOPSY;  Surgeon: Lenis Ethyl, MD;  Location: [facility] OR;  Service: General;  Laterality: Right;   REMOVAL OF TISSUE EXPANDER AND PLACEMENT OF IMPLANT Right 06/23/2015   Procedure: REMOVAL OF RIGHT BREAST TISSUE EXPANDER AND PLACEMENT OF SILICONE IMPLANT;  Surgeon: Estefana RAMAN Fritter, DO;  Location: [facility];  Service: Plastics;  Laterality: Right;   SKIN BIOPSY Right 05/2013   basal cell on thigh   VAGINAL DELIVERY     /w epidural    WISDOM TOOTH EXTRACTION  early 2000's   /w IV sedation      Current Medications[1]   Objective:   Vitals:   05/22/24 1045  BP: 118/68  Pulse: 83  SpO2: 99%    Physical Exam Vitals reviewed.  Constitutional:      Appearance: Normal appearance.  HENT:     Head: Atraumatic.  Cardiovascular:     Rate and Rhythm: Normal rate.     Pulses: Normal pulses.  Pulmonary:     Effort: Pulmonary effort is normal.  Abdominal:     Palpations: Abdomen is soft.  Skin:    General: Skin is warm.     Capillary Refill: Capillary refill takes less than 2 seconds.  Neurological:     Mental Status: She is alert and oriented to person, place, and time.  Psychiatric:        Mood and Affect: Mood normal.        Behavior: Behavior normal.        Thought Content: Thought content normal.        Judgment: Judgment normal.     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  Will plan on seeing her back  in 1 year if anything changes she knows to give us  a call.  Over the next 1 to 2 years we can talk about when she would like to have new implant placed.  Estefana RAMAN Alfonza Toft, DO    [1]  Current Outpatient Medications:    CALCIUM PO, Take by mouth., Disp: , Rfl:    Multiple Vitamin (MULTIVITAMIN) capsule, Take 1 capsule by mouth daily., Disp: , Rfl:    tamoxifen  (NOLVADEX ) 20 MG tablet, TAKE 1 TABLET DAILY, Disp: 90 tablet, Rfl: 3  "

## 2024-07-31 ENCOUNTER — Ambulatory Visit: Admitting: Obstetrics and Gynecology

## 2024-11-01 ENCOUNTER — Other Ambulatory Visit

## 2024-11-01 ENCOUNTER — Ambulatory Visit: Admitting: Hematology
